# Patient Record
Sex: Female | Born: 1965
Health system: Southern US, Community
[De-identification: ages and names within clinical notes are randomized; demographics above are authoritative.]

## PROBLEM LIST (undated history)

## (undated) DIAGNOSIS — K219 Gastro-esophageal reflux disease without esophagitis: Secondary | ICD-10-CM

## (undated) DIAGNOSIS — Z8619 Personal history of other infectious and parasitic diseases: Secondary | ICD-10-CM

## (undated) DIAGNOSIS — K921 Melena: Secondary | ICD-10-CM

## (undated) DIAGNOSIS — E785 Hyperlipidemia, unspecified: Secondary | ICD-10-CM

## (undated) DIAGNOSIS — Z86010 Personal history of colonic polyps: Secondary | ICD-10-CM

## (undated) DIAGNOSIS — E039 Hypothyroidism, unspecified: Secondary | ICD-10-CM

## (undated) DIAGNOSIS — K5792 Diverticulitis of intestine, part unspecified, without perforation or abscess without bleeding: Secondary | ICD-10-CM

## (undated) DIAGNOSIS — E063 Autoimmune thyroiditis: Secondary | ICD-10-CM

## (undated) DIAGNOSIS — D649 Anemia, unspecified: Secondary | ICD-10-CM

## (undated) HISTORY — DX: Personal history of colonic polyps: Z86.010

## (undated) HISTORY — PX: TUBAL LIGATION: SHX77

## (undated) HISTORY — PX: NISSEN FUNDOPLICATION: SHX2091

## (undated) HISTORY — DX: Gastro-esophageal reflux disease without esophagitis: K21.9

## (undated) HISTORY — DX: Anemia, unspecified: D64.9

## (undated) HISTORY — DX: Melena: K92.1

## (undated) HISTORY — DX: Hyperlipidemia, unspecified: E78.5

## (undated) HISTORY — DX: Diverticulitis of intestine, part unspecified, without perforation or abscess without bleeding: K57.92

## (undated) HISTORY — DX: Personal history of other infectious and parasitic diseases: Z86.19

---
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
## 2001-01-28 ENCOUNTER — Encounter (INDEPENDENT_AMBULATORY_CARE_PROVIDER_SITE_OTHER): Payer: Self-pay

## 2001-01-28 ENCOUNTER — Ambulatory Visit (HOSPITAL_COMMUNITY): Admission: RE | Admit: 2001-01-28 | Discharge: 2001-01-28 | Payer: Self-pay | Admitting: *Deleted

## 2001-05-06 ENCOUNTER — Ambulatory Visit (HOSPITAL_COMMUNITY): Admission: RE | Admit: 2001-05-06 | Discharge: 2001-05-06 | Payer: Self-pay | Admitting: Gastroenterology

## 2001-05-16 ENCOUNTER — Encounter: Admission: RE | Admit: 2001-05-16 | Discharge: 2001-05-16 | Payer: Self-pay | Admitting: General Surgery

## 2001-05-16 ENCOUNTER — Encounter: Payer: Self-pay | Admitting: General Surgery

## 2001-05-21 ENCOUNTER — Encounter: Payer: Self-pay | Admitting: General Surgery

## 2001-05-21 ENCOUNTER — Encounter: Admission: RE | Admit: 2001-05-21 | Discharge: 2001-05-21 | Payer: Self-pay | Admitting: General Surgery

## 2001-06-08 ENCOUNTER — Inpatient Hospital Stay (HOSPITAL_COMMUNITY): Admission: EM | Admit: 2001-06-08 | Discharge: 2001-06-09 | Payer: Self-pay | Admitting: General Surgery

## 2001-08-15 ENCOUNTER — Encounter: Admission: RE | Admit: 2001-08-15 | Discharge: 2001-08-15 | Payer: Self-pay | Admitting: General Surgery

## 2001-08-15 ENCOUNTER — Encounter: Payer: Self-pay | Admitting: General Surgery

## 2004-08-03 ENCOUNTER — Ambulatory Visit (HOSPITAL_COMMUNITY): Admission: RE | Admit: 2004-08-03 | Discharge: 2004-08-03 | Payer: Self-pay | Admitting: Gastroenterology

## 2004-08-03 ENCOUNTER — Encounter (INDEPENDENT_AMBULATORY_CARE_PROVIDER_SITE_OTHER): Payer: Self-pay | Admitting: Specialist

## 2004-12-14 ENCOUNTER — Encounter: Admission: RE | Admit: 2004-12-14 | Discharge: 2004-12-14 | Payer: Self-pay | Admitting: Gastroenterology

## 2007-01-02 ENCOUNTER — Other Ambulatory Visit: Admission: RE | Admit: 2007-01-02 | Discharge: 2007-01-02 | Payer: Self-pay | Admitting: Gynecology

## 2007-09-13 ENCOUNTER — Encounter: Admission: RE | Admit: 2007-09-13 | Discharge: 2007-09-13 | Payer: Self-pay | Admitting: Gynecology

## 2007-09-20 ENCOUNTER — Encounter: Admission: RE | Admit: 2007-09-20 | Discharge: 2007-09-20 | Payer: Self-pay | Admitting: Gynecology

## 2009-12-29 ENCOUNTER — Ambulatory Visit: Payer: Self-pay | Admitting: Gynecology

## 2009-12-29 ENCOUNTER — Other Ambulatory Visit: Admission: RE | Admit: 2009-12-29 | Discharge: 2009-12-29 | Payer: Self-pay | Admitting: Gynecology

## 2010-06-26 ENCOUNTER — Encounter: Payer: Self-pay | Admitting: Gynecology

## 2010-10-21 NOTE — Op Note (Signed)
NAMECORLENE, SABIA                 ACCOUNT NO.:  [id number]   MEDICAL RECORD NO.:  [id number]          PATIENT TYPE:  AMB   LOCATION:  ENDO                         FACILITY:  [facility]   PHYSICIAN:  James L. Malon Kindle., M.D.DATE OF BIRTH:  [birth year]   DATE OF PROCEDURE:  08/03/2004  DATE OF DISCHARGE:                                 OPERATIVE REPORT   PROCEDURE:  Colonoscopy and coagulation of polyp.   MEDICATIONS:  The patient received a total of fentanyl 125 mcg, Versed 12 mg  for both procedures.   SCOPE:  Olympus pediatric adjustable colonoscope.   INDICATIONS:  Previous history of colon polyps. This is done as a followup  procedure.   DESCRIPTION OF PROCEDURE:  The procedure had been explained to the patient  and consent obtained.  With the patient in the left lateral decubitus  position, the Olympus pediatric adjustable scope was inserted and advanced.  The prep was excellent.  Using abdominal pressure and position changes, we  were able to advance to the cecum.  The ileocecal valve and appendiceal  orifice were seen.  The  scope was withdrawn and the cecum, ascending colon  and transverse colon were normal.  In the descending colon a 3 mm sessile  polyp was encountered and was cauterized with the hot biopsy forceps.  No  other polyps were seen in the descending colon or sigmoid.  The rectum was  free of polyps.  The scope was withdrawn. The patient tolerated the  procedure well.   ASSESSMENT:  1.  Descending colon polyp cauterized, 210.3.  2.  History of colon polyps, V12.72.   PLAN:  Will check pathology.  Routine post polypectomy instructions.  Will  recommend repeating procedure in five years and recommend yearly hemoccults.      JLE/MEDQ  D:  08/03/2004  T:  08/03/2004  Job:  [id number]

## 2010-10-21 NOTE — Op Note (Signed)
[facility]  Patient:    [name], [name] Visit Number: [id number] MRN: [id number]          Service Type: SUR Location: 4W 0484 02 Attending Physician:  Brandy Hale Dictated by:   Angelia Mould. Derrell Lolling, M.D. Proc. Date: 06/07/01 Admit Date:  06/07/2001   CC:         Fayrene Fearing L. Randa Evens, M.D.  Jamesetta Geralds, M.D.   Operative Report  PREOPERATIVE DIAGNOSIS:  Gastroesophageal reflux disease with Barretts esophagus.  POSTOPERATIVE DIAGNOSIS:  Gastroesophageal reflux disease with Barretts esophagus.  OPERATION PERFORMED:  Laparoscopic Nissen fundoplication.  SURGEON:  Angelia Mould. Derrell Lolling, M.D.  FIRST ASSISTANT:  Sharlet Salina T. Hoxworth, M.D.  OPERATIVE INDICATIONS:  This is a 45 year old white female who has a four year history of heartburn and reflux which has been progressive despite the use of high dose proton pump inhibitors.  Endoscopy shows Barretts esophagus which is histologically confirmed, but there is no dysplasia.  She also had some small esophageal ulcers just above the GE junction on upper endoscopy on January 28, 2001.  Upper GI shows good peristalsis, and no mucosal abnormality of the esophagus or stomach, and no emptying problems.  She refused esophageal manometry.  Gallbladder ultrasound is normal.  Because of progressive symptoms and her Barretts esophagus, she is brought to the operating room electively for antireflux surgery.  DESCRIPTION OF PROCEDURE:  Following the induction of general endotracheal anesthesia, the patients abdomen was prepped and draped in a sterile fashion. Marcaine 0.5% with epinephrine was used as a local infiltration anesthetic.  A short vertical incision was made above the umbilicus.  The fascia was incised in the midline and the abdominal cavity entered under direct vision.  A 10 mm Hasson trocar was inserted and secured with a pursestring suture of 0 Vicryl. Pneumoperitoneum was created.  Video camera was  inserted.  She had a very large falciform ligament which we had to elevate to visualize everything. Survey of the abdomen showed that the liver appeared to be normal.  The stomach and duodenum appeared normal.  The gallbladder looks normal.  The large bowel and small bowel and the peritoneal surfaces looked normal.  She had slightly enlarged hiatus which required a closure.  The spleen looked normal.  A 5 mm trocar was placed in the subxiphoid area, and a 5 mm liver retractor was placed to elevate the left lobe of the liver.  A 10 mm trocar was placed in the left subcostal region and two 10 mm trocars were placed in the right upper quadrant.  We used the [employer].  With this, we took down the gastrohepatic ligament.  We dissected the right crus away from the esophagus, identifying the posterior vagus nerve which was preserved against the posterior wall of the esophagus.  The areolar tissue around the apex of the right and left crura was taken down.  We mobilized the left crura anteriorly.  We then divided the short gastric vessels with the ultrasonic dissector.  We had to take some care around the fundus as the stomach was very closely applied to the splenic capsule, but we were able to do this without much bleeding at all, and the stomach looked fine at the completion of this dissection.  We further rolled the fundus of the stomach to the right, and with this, we were able to completely dissect the fundus off of the left crus. We created a fairly generous window behind the esophagus and visualized all  the structures.  The fundus was quite floppy without any tension on it whatsoever.  We closed the crura posteriorly with two interrupted sutures of 0 Surgitek. We then passed a 46-French dilator down beside the 18-French nasogastric tube and it appeared that the crural closure was adequate and it did not require any further sutures.  We passed the fundus  behind the esophagus and found that we could do a very nice loose fundoplication without any problem.  With the dilator in place, we performed a fundoplication with three interrupted sutures of 0 Surgitek.  A suture was placed in the left side of the fundus, anterior wall of the esophagus, and right side of the fundus, and then each of the three sutures was tied and marked with a clip.  This looked like a very good loose fundoplication and without any tension whatsoever.  The dilator was removed.  During the case, we had a small superficial laceration of the left lobe of the liver posteriorly, but this was controlled with a little bit of pressure and Surgicel, and at the completion of the case, we irrigated this area out, and there was no bleeding whatsoever.  A small piece of Surgicel was left in place in this area.  Photographs were taken.  The trocars were removed under direct vision and there was no bleeding from the trocar sites.  The pneumoperitoneum was released.  The fascia at the supraumbilical trocar site was closed with 0 Vicryl suture.  The skin incisions were closed with subcuticular sutures of 4-0 Vicryl and Steri-Strips.  Clean bandages were placed and the patient taken to the recovery room in stable condition.  Estimated blood loss was about 30 cc.  Complications none. Sponge and instrument counts were correct. Dictated by:   Angelia Mould. Derrell Lolling, M.D. Attending Physician:  Brandy Hale DD:  06/07/01 TD:  06/08/01 Job: [id number]

## 2010-10-21 NOTE — Procedures (Signed)
. [facility]  Patient:    BELEM, HINTZE Visit Number: [id number] MRN: [id number]          Service Type: END Location: ENDO Attending Physician:  Orland Mustard Dictated by:   Llana Aliment. Randa Evens, M.D. Proc. Date: 01/28/01 Admit Date:  05/06/2001 Discharge Date: 05/06/2001   CC:         Jamesetta Geralds, M.D.   Procedure Report  PROCEDURE PERFORMED:  Colonoscopy and polypectomy.  ENDOSCOPIST:  Llana Aliment. Randa Evens, M.D.  MEDICATIONS USED:  Fentanyl 125 mcg, Versed 12 mg IV.  INSTRUMENT:  Pediatric Olympus video colonoscope.  INDICATIONS:  Previous history of colon polyps removed in another location.  DESCRIPTION OF PROCEDURE:  The procedure had been explained to the patient and consent obtained.  With the patient in the left lateral decubitus position following endoscopy, she was turned around and the scope was inserted and advanced under direct visualization.  The prep was excellent and we were able to advance to the cecum.  The scope was withdrawn.  The cecum, ascending colon, hepatic flexure, transverse colon, splenic flexure, descending and sigmoid colon were seen well.  The sigmoid colon two polyps 0.5 cm each were removed with the snare and sucked through the scope.  No other polyps were seen.  Scope withdrawn, patient tolerated the procedure well.  Maintained on low flow oxygen and pulse oximeter throughout the procedure with no obvious problem.  ASSESSMENT:  Sigmoid colon polyps removed.  PLAN:  Will recommend repeating in three years. Dictated by:   Llana Aliment. Randa Evens, M.D. Attending Physician:  Orland Mustard DD:  05/07/01 TD:  05/08/01 Job: [id number]

## 2010-10-21 NOTE — Procedures (Signed)
[facility]  Patient:    [name], [name] Visit Number: [id number] MRN: [id number]          Service Type: END Location: ENDO Attending Physician:  Orland Mustard Proc. Date: 01/28/01 Adm. Date:  01/28/2001   CC:         Vikki Ports, M.D.  Jamesetta Geralds, M.D.   Procedure Report  PROCEDURE:  Esophagogastroduodenoscopy with biopsy.  MEDICATIONS:  Hurricaine spray, fentanyl 75 mcg, Versed 8 mg IV.  INDICATION:  Previous history of Barretts esophagus  DESCRIPTION OF PROCEDURE:  The procedure had been explained to the patient and consent obtained.  With the patient in the left lateral decubitus position, the Olympus video endoscope was inserted and advanced under direct visualization.  The distal esophagus was reached.  The patient had a fairly distinct squamous columnar junction with possibly a small area of Barretts. It appeared to be more of a hiatal hernia than a long segment of Barretts. There were two esophageal gastric ulcers right at the apparent GE junction that were quite small diametrically across from each other, similar to kissing ulcers.  I did take multiple biopsies of the hiatal hernia sac.  We passed on down to the stomach, and the pylorus identified and passed.  The duodenum, including the bulb and second portion, were seen well and were unremarkable. The scope was withdrawn.  The patient tolerated the procedure well, was maintained on low-flow oxygen and pulse oximetry throughout the procedure.  ASSESSMENT: 1. Esophageal ulcers, probably consistent with continuent reflux. 2. Barretts esophagus versus hiatal hernia.  My impression is that this    probably is a hiatal hernia sac and not true Barretts.  PLAN: 1. We will check pathology for intestinal metaplasia. 2. Increase Nexium to b.i.d. 3. We will see back in the office in two months and discuss antireflux    procedure. Attending Physician:  Orland Mustard DD:   01/28/01 TD:  01/29/01 Job: [id number]

## 2010-10-21 NOTE — Op Note (Signed)
[name], [name]                 ACCOUNT NO.:  [id number]   MEDICAL RECORD NO.:  [id number]          PATIENT TYPE:  AMB   LOCATION:  ENDO                         FACILITY:  [facility]   PHYSICIAN:  James L. Malon Kindle., M.D.DATE OF BIRTH:  [birth year]   DATE OF PROCEDURE:  08/03/2004  DATE OF DISCHARGE:                                 OPERATIVE REPORT   PROCEDURE:  Esophagogastroduodenoscopy.   MEDICATIONS:  Fentanyl 75 mcg, Versed 10 mg IV.   INDICATIONS:  A nice 45 year old who has had previous Nissen fundoplication.  Had a lot of bloating and some reflux.  In view of this, endoscopy was  performed.  There was some question of varix and we wanted to re-evaluate  this area.   DESCRIPTION OF PROCEDURE:  The procedure had been explained to the patient  and consent obtained.  The patient was in the left lateral decubitus  position.  The Olympus scope was inserted and advanced.  We advanced into  the stomach.  The pylorus was identified and passed.  The duodenum including  the bulb and second portion were seen well.  There were no signs of gastric  outlet obstruction or ulcerations, etc., in the duodenum.  The scope was  placed in the retroflex view.  The wrap was seen to be intact.  There was a  bit of hiatal hernia above the wrap but there was a very sharp demarcation  line at the GE junction.  There was no obvious signs of Barrett's esophagus.  The distal esophagus and proximal esophagus were endoscopically normal.  The  scope was withdrawn.  The patient tolerated the procedure well.   ASSESSMENT:  Bloating and reflux appears to be doing well after Nissen  fundoplication.  No signs of Barrett's esophagus.  530.81.   PLAN:  We will start the patient on Reglan and see if that  helps her  bloating, 10 mg b.i.d. and see back in the office in two months or so.  Will  proceed with colonoscopy at this time.      JLE/MEDQ  D:  08/03/2004  T:  08/03/2004  Job:  [id number]   cc:   Leanne Chang, M.D.  [address]  [place]  [place] [address]  Fax: [phone]   Angelia Mould. Derrell Lolling, M.D.  [address]  [place]  [place] [address]

## 2011-04-25 ENCOUNTER — Ambulatory Visit (INDEPENDENT_AMBULATORY_CARE_PROVIDER_SITE_OTHER): Payer: 59 | Admitting: Gynecology

## 2011-04-25 ENCOUNTER — Other Ambulatory Visit (HOSPITAL_COMMUNITY)
Admission: RE | Admit: 2011-04-25 | Discharge: 2011-04-25 | Disposition: A | Discharge status: Home / Self Care | Payer: 59 | Source: Ambulatory Visit | Attending: Gynecology | Admitting: Gynecology

## 2011-04-25 ENCOUNTER — Encounter: Payer: Self-pay | Admitting: Gynecology

## 2011-04-25 VITALS — BP 124/78 | Ht 65.0 in | Wt 164.0 lb

## 2011-04-25 DIAGNOSIS — Z01419 Encounter for gynecological examination (general) (routine) without abnormal findings: Secondary | ICD-10-CM

## 2011-04-25 DIAGNOSIS — F17200 Nicotine dependence, unspecified, uncomplicated: Secondary | ICD-10-CM | POA: Insufficient documentation

## 2011-04-25 DIAGNOSIS — E7800 Pure hypercholesterolemia, unspecified: Secondary | ICD-10-CM

## 2011-04-25 DIAGNOSIS — R7989 Other specified abnormal findings of blood chemistry: Secondary | ICD-10-CM

## 2011-04-25 DIAGNOSIS — R635 Abnormal weight gain: Secondary | ICD-10-CM

## 2011-04-25 DIAGNOSIS — R319 Hematuria, unspecified: Secondary | ICD-10-CM

## 2011-04-25 LAB — CBC WITH DIFFERENTIAL/PLATELET
Eosinophils Absolute: 0.2 10*3/uL (ref 0.0–0.7)
Eosinophils Relative: 2 % (ref 0–5)
Hemoglobin: 12.6 g/dL (ref 12.0–15.0)
Lymphocytes Relative: 27 % (ref 12–46)
Lymphs Abs: 2.7 10*3/uL (ref 0.7–4.0)
MCH: 28.6 pg (ref 26.0–34.0)
MCV: 88.4 fL (ref 78.0–100.0)
Monocytes Relative: 5 % (ref 3–12)
Platelets: 323 10*3/uL (ref 150–400)
RBC: 4.4 MIL/uL (ref 3.87–5.11)
WBC: 9.9 10*3/uL (ref 4.0–10.5)

## 2011-04-25 LAB — TSH: TSH: 5.03 u[IU]/mL — ABNORMAL HIGH (ref 0.350–4.500)

## 2011-04-25 LAB — CHOLESTEROL, TOTAL: Cholesterol: 225 mg/dL — ABNORMAL HIGH (ref 0–200)

## 2011-04-25 NOTE — Progress Notes (Signed)
[name] [birth year] [id number]   History:    45 y.o.  for annual exam with no complaints. Review of her record indicated she's had a previous tubal sterilization procedure. She is adopted. She is a chronic smoker whereby she smokes one pack a cigarettes every 3 days is currently working on this vigorously whereby she is tired a trainer 3 times a week and works in the gym 5 times a week. Her last mammogram was in 2009. She does her monthly self breast examination. Her cycles are regular every 30 days lasting 5 days. She had been concerned with some weight gain although she is exercising. Her BMI is 28.  Past medical history,surgical history, family history and social history were all reviewed and documented in the EPIC chart.  Gynecologic History Patient's last menstrual period was 03/24/2011. Contraception: tubal ligation Last Pap: 2011. Results were: normal Last mammogram: 2009. Results were: normal  Obstetric History OB History    Grav Para Term Preterm Abortions TAB SAB Ect Mult Living   3 2 2  1  1   2      # Outc Date GA Lbr Len/2nd Wgt Sex Del Anes PTL Lv   1 TRM     F SVD  No Yes   2 TRM     M SVD  No Yes   3 SAB                ROS:  Was performed and pertinent positives and negatives are included in the history.  Exam: chaperone present  BP 124/78  Ht 5\' 5"  (1.651 m)  Wt 164 lb (74.39 kg)  BMI 27.29 kg/m2  LMP 03/24/2011  Body mass index is 27.29 kg/(m^2).  General appearance : Well developed well nourished female. No acute distress HEENT: Neck supple, trachea midline, no carotid bruits, no thyroidmegaly Lungs: Clear to auscultation, no rhonchi or wheezes, or rib retractions  Heart: Regular rate and rhythm, no murmurs or gallops Breast:Examined in sitting and supine position were symmetrical in appearance, no palpable masses or tenderness,  no skin retraction, no nipple inversion, no nipple discharge, no skin discoloration, no axillary or supraclavicular  lymphadenopathy Abdomen: no palpable masses or tenderness, no rebound or guarding Extremities: no edema or skin discoloration or tenderness  Pelvic:  Bartholin, Urethra, Skene Glands: Within normal limits             Vagina: No gross lesions or discharge  Cervix: No gross lesions or discharge  Uterus  anteverted, normal size, shape and consistency, non-tender and mobile  Adnexa  Without masses or tenderness  Anus and perineum  normal   Rectovaginal  normal sphincter tone without palpated masses or tenderness             Hemoccult not done     Assessment/Plan:  45 y.o. female for annual exam unremarkable. Patient was given a requisition to schedule her mammogram. [facility] and lateral because of her chronic history of smoking. The following labs will be drawn CBC, TSH, random blood sugar, urinalysis, cholesterol along with her Pap smear. She was encouraged to continue monthly self breast examination and continue to work on her efforts to stop smoking all together because of her increased risk of osteoporosis. She was also instructed to take her calcium and vitamin D one tablet twice a day. We'll see her back in one year or when necessary.    Ok Edwards MD, 12:57  PM 04/25/2011

## 2011-04-25 NOTE — Progress Notes (Signed)
Addended by: Landis Martins R on: 04/25/2011 01:20 PM   Modules accepted: Orders

## 2011-04-26 LAB — URINALYSIS
Leukocytes, UA: NEGATIVE
Nitrite: NEGATIVE
Protein, ur: NEGATIVE mg/dL
pH: 5.5 (ref 5.0–8.0)

## 2011-04-26 NOTE — Progress Notes (Signed)
Addended by: Aura Camps on: 04/26/2011 03:31 PM   Modules accepted: Orders

## 2011-06-23 ENCOUNTER — Other Ambulatory Visit: Payer: 59

## 2011-07-12 ENCOUNTER — Other Ambulatory Visit: Payer: 59

## 2011-07-12 ENCOUNTER — Other Ambulatory Visit: Payer: Self-pay | Admitting: *Deleted

## 2011-07-12 DIAGNOSIS — R7989 Other specified abnormal findings of blood chemistry: Secondary | ICD-10-CM

## 2011-07-12 DIAGNOSIS — E7800 Pure hypercholesterolemia, unspecified: Secondary | ICD-10-CM

## 2011-07-12 DIAGNOSIS — E079 Disorder of thyroid, unspecified: Secondary | ICD-10-CM

## 2011-07-13 LAB — THYROID PANEL WITH TSH
Free Thyroxine Index: 2.3 (ref 1.0–3.9)
T3 Uptake: 30.7 % (ref 22.5–37.0)
T4, Total: 7.6 ug/dL (ref 5.0–12.5)
TSH: 6.76 u[IU]/mL — ABNORMAL HIGH (ref 0.350–4.500)

## 2011-07-13 LAB — LIPID PANEL: Cholesterol: 246 mg/dL — ABNORMAL HIGH (ref 0–200)

## 2011-07-14 ENCOUNTER — Ambulatory Visit (INDEPENDENT_AMBULATORY_CARE_PROVIDER_SITE_OTHER): Payer: 59 | Admitting: Gynecology

## 2011-07-14 ENCOUNTER — Encounter: Payer: Self-pay | Admitting: Gynecology

## 2011-07-14 VITALS — BP 110/80

## 2011-07-14 DIAGNOSIS — E7800 Pure hypercholesterolemia, unspecified: Secondary | ICD-10-CM

## 2011-07-14 DIAGNOSIS — Z860100 Personal history of colon polyps, unspecified: Secondary | ICD-10-CM

## 2011-07-14 DIAGNOSIS — E039 Hypothyroidism, unspecified: Secondary | ICD-10-CM

## 2011-07-14 LAB — AST: AST: 16 U/L (ref 0–37)

## 2011-07-14 LAB — ALT: ALT: 11 U/L (ref 0–35)

## 2011-07-14 MED ORDER — ATORVASTATIN CALCIUM 10 MG PO TABS
10.0000 mg | ORAL_TABLET | Freq: Every day | ORAL | Status: DC
Start: 2011-07-14 — End: 2012-05-24

## 2011-07-14 MED ORDER — LEVOTHYROXINE SODIUM 50 MCG PO TABS: 50.0000 ug | ORAL_TABLET | Freq: Every day | ORAL | Status: DC

## 2011-07-14 NOTE — Patient Instructions (Signed)
Cholesterol Control Diet Cholesterol levels in your body are determined significantly by your diet. Cholesterol levels may also be related to heart disease. The following material helps to explain this relationship and discusses what you can do to help keep your heart healthy. Not all cholesterol is bad. Low-density lipoprotein (LDL) cholesterol is the "bad" cholesterol. It may cause fatty deposits to build up inside your arteries. High-density lipoprotein (HDL) cholesterol is "good." It helps to remove the "bad" LDL cholesterol from your blood. Cholesterol is a very important risk factor for heart disease. Other risk factors are high blood pressure, smoking, stress, heredity, and weight. The heart muscle gets its supply of blood through the coronary arteries. If your LDL cholesterol is high and your HDL cholesterol is low, you are at risk for having fatty deposits build up in your coronary arteries. This leaves less room through which blood can flow. Without sufficient blood and oxygen, the heart muscle cannot function properly and you may feel chest pains (angina pectoris). When a coronary artery closes up entirely, a part of the heart muscle may die, causing a heart attack (myocardial infarction). CHECKING CHOLESTEROL When your caregiver sends your blood to a lab to be analyzed for cholesterol, a complete lipid (fat) profile may be done. With this test, the total amount of cholesterol and levels of LDL and HDL are determined. Triglycerides are a type of fat that circulates in the blood and can also be used to determine heart disease risk. The list below describes what the numbers should be: Test: Total Cholesterol.  Less than 200 mg/dl.  Test: LDL "bad cholesterol."  Less than 100 mg/dl.   Less than 70 mg/dl if you are at very high risk of a heart attack or sudden cardiac death.  Test: HDL "good cholesterol."  Greater than 50 mg/dl for women.   Greater than 40 mg/dl for men.  Test:  Triglycerides.  Less than 150 mg/dl.  CONTROLLING CHOLESTEROL WITH DIET Although exercise and lifestyle factors are important, your diet is key. That is because certain foods are known to raise cholesterol and others to lower it. The goal is to balance foods for their effect on cholesterol and more importantly, to replace saturated and trans fat with other types of fat, such as monounsaturated fat, polyunsaturated fat, and omega-3 fatty acids. On average, a person should consume no more than 15 to 17 g of saturated fat daily. Saturated and trans fats are considered "bad" fats, and they will raise LDL cholesterol. Saturated fats are primarily found in animal products such as meats, butter, and cream. However, that does not mean you need to sacrifice all your favorite foods. Today, there are good tasting, low-fat, low-cholesterol substitutes for most of the things you like to eat. Choose low-fat or nonfat alternatives. Choose round or loin cuts of red meat, since these types of cuts are lowest in fat and cholesterol. Chicken (without the skin), fish, veal, and ground turkey breast are excellent choices. Eliminate fatty meats, such as hot dogs and salami. Even shellfish have little or no saturated fat. Have a 3 oz (85 g) portion when you eat lean meat, poultry, or fish. Trans fats are also called "partially hydrogenated oils." They are oils that have been scientifically manipulated so that they are solid at room temperature resulting in a longer shelf life and improved taste and texture of foods in which they are added. Trans fats are found in stick margarine, some tub margarines, cookies, crackers, and baked goods.  When   baking and cooking, oils are an excellent substitute for butter. The monounsaturated oils are especially beneficial since it is believed they lower LDL and raise HDL. The oils you should avoid entirely are saturated tropical oils, such as coconut and palm.  Remember to eat liberally from food  groups that are naturally free of saturated and trans fat, including fish, fruit, vegetables, beans, grains (barley, rice, couscous, bulgur wheat), and pasta (without cream sauces).  IDENTIFYING FOODS THAT LOWER CHOLESTEROL  Soluble fiber may lower your cholesterol. This type of fiber is found in fruits such as apples, vegetables such as broccoli, potatoes, and carrots, legumes such as beans, peas, and lentils, and grains such as barley. Foods fortified with plant sterols (phytosterol) may also lower cholesterol. You should eat at least 2 g per day of these foods for a cholesterol lowering effect.  Read package labels to identify low-saturated fats, trans fats free, and low-fat foods at the supermarket. Select cheeses that have only 2 to 3 g saturated fat per ounce. Use a heart-healthy tub margarine that is free of trans fats or partially hydrogenated oil. When buying baked goods (cookies, crackers), avoid partially hydrogenated oils. Breads and muffins should be made from whole grains (whole-wheat or whole oat flour, instead of "flour" or "enriched flour"). Buy non-creamy canned soups with reduced salt and no added fats.  FOOD PREPARATION TECHNIQUES  Never deep-fry. If you must fry, either stir-fry, which uses very little fat, or use non-stick cooking sprays. When possible, broil, bake, or roast meats, and steam vegetables. Instead of dressing vegetables with butter or margarine, use lemon and herbs, applesauce and cinnamon (for squash and sweet potatoes), nonfat yogurt, salsa, and low-fat dressings for salads.  LOW-SATURATED FAT / LOW-FAT FOOD SUBSTITUTES Meats / Saturated Fat (g)  Avoid: Steak, marbled (3 oz/85 g) / 11 g   Choose: Steak, lean (3 oz/85 g) / 4 g   Avoid: Hamburger (3 oz/85 g) / 7 g   Choose: Hamburger, lean (3 oz/85 g) / 5 g   Avoid: Ham (3 oz/85 g) / 6 g   Choose: Ham, lean cut (3 oz/85 g) / 2.4 g   Avoid: Chicken, with skin, dark meat (3 oz/85 g) / 4 g   Choose: Chicken,  skin removed, dark meat (3 oz/85 g) / 2 g   Avoid: Chicken, with skin, light meat (3 oz/85 g) / 2.5 g   Choose: Chicken, skin removed, light meat (3 oz/85 g) / 1 g  Dairy / Saturated Fat (g)  Avoid: Whole milk (1 cup) / 5 g   Choose: Low-fat milk, 2% (1 cup) / 3 g   Choose: Low-fat milk, 1% (1 cup) / 1.5 g   Choose: Skim milk (1 cup) / 0.3 g   Avoid: Hard cheese (1 oz/28 g) / 6 g   Choose: Skim milk cheese (1 oz/28 g) / 2 to 3 g   Avoid: Cottage cheese, 4% fat (1 cup) / 6.5 g   Choose: Low-fat cottage cheese, 1% fat (1 cup) / 1.5 g   Avoid: Ice cream (1 cup) / 9 g   Choose: Sherbet (1 cup) / 2.5 g   Choose: Nonfat frozen yogurt (1 cup) / 0.3 g   Choose: Frozen fruit bar / trace   Avoid: Whipped cream (1 tbs) / 3.5 g   Choose: Nondairy whipped topping (1 tbs) / 1 g  Condiments / Saturated Fat (g)  Avoid: Mayonnaise (1 tbs) / 2 g   Choose: Low-fat mayonnaise (  1 tbs) / 1 g   Avoid: Butter (1 tbs) / 7 g   Choose: Extra light margarine (1 tbs) / 1 g   Avoid: Coconut oil (1 tbs) / 11.8 g   Choose: Olive oil (1 tbs) / 1.8 g   Choose: Corn oil (1 tbs) / 1.7 g   Choose: Safflower oil (1 tbs) / 1.2 g   Choose: Sunflower oil (1 tbs) / 1.4 g   Choose: Soybean oil (1 tbs) / 2.4 g   Choose: Canola oil (1 tbs) / 1 g  Document Released: 05/22/2005 Document Revised: 02/01/2011 Document Reviewed: 11/10/2010 [employer] Patient Information 2012 [employer]. 

## 2011-07-14 NOTE — Progress Notes (Signed)
The patient is a 46 year old who was seen the office on November 20 her annual gynecological examination. Patient presented to the office to discuss recent abnormal labs as follows:  Total cholesterol elevated at 246 Triglycerides elevated at 283 HDL low at 31 LDL elevated at 158 VLDL elevated at 57   TSH elevated at 6.70   Patient is adopted so she does not know anything about her past medical history. Patient recently did lifestyle modification changes she stopped smoking 2 months ago. The past 8 months she's been working with a trainer on a regular basis and has had nutritional modification as well. Despite all this patient with evidence of hyperlipidemia. We had a lengthy discussion on the risks benefits and pros and cons statins to include muscle aches, long term usage associated with type 2 diabetes and most recently released information the potential risk of macular degeneration. The benefits outweigh the risk in this patient she fully understands and accepts. She should continue with her diet and exercise program. Additional information on cholesterol-lowering diet will be provided today.  Patient also had been complaining of tiredness and fatigue and low energy which would explain her elevated TSH. We discussed thyroid treatment its risks benefits and pros and cons.  Assessment/plan: #1. Hyperlipidemia. Patient will be placed on Lipitor 10 mg 1 by mouth daily. Patient will have baseline liver function test today to include SGOT and SGPT her baseline. We'll repeat in 6 months along with a fasting lipid profile and office visit. She was instructed also to take only to 3 fish oil 800 mg daily. #2 hypothyroidism patient will be started on Synthroid 50 mcg daily and will return to the office in one month to check TSH levels. Patient with prior tubal sterilization procedure. All questions were answered we'll follow accordingly.

## 2012-05-24 ENCOUNTER — Encounter: Payer: Self-pay | Admitting: Gynecology

## 2012-05-24 ENCOUNTER — Ambulatory Visit (INDEPENDENT_AMBULATORY_CARE_PROVIDER_SITE_OTHER): Payer: 59 | Admitting: Gynecology

## 2012-05-24 VITALS — BP 120/88 | Ht 65.0 in | Wt 179.5 lb

## 2012-05-24 DIAGNOSIS — Z23 Encounter for immunization: Secondary | ICD-10-CM

## 2012-05-24 DIAGNOSIS — R635 Abnormal weight gain: Secondary | ICD-10-CM

## 2012-05-24 DIAGNOSIS — K219 Gastro-esophageal reflux disease without esophagitis: Secondary | ICD-10-CM | POA: Insufficient documentation

## 2012-05-24 DIAGNOSIS — E785 Hyperlipidemia, unspecified: Secondary | ICD-10-CM

## 2012-05-24 DIAGNOSIS — E039 Hypothyroidism, unspecified: Secondary | ICD-10-CM

## 2012-05-24 DIAGNOSIS — N938 Other specified abnormal uterine and vaginal bleeding: Secondary | ICD-10-CM

## 2012-05-24 DIAGNOSIS — F17200 Nicotine dependence, unspecified, uncomplicated: Secondary | ICD-10-CM

## 2012-05-24 DIAGNOSIS — Z01419 Encounter for gynecological examination (general) (routine) without abnormal findings: Secondary | ICD-10-CM

## 2012-05-24 DIAGNOSIS — N949 Unspecified condition associated with female genital organs and menstrual cycle: Secondary | ICD-10-CM

## 2012-05-24 MED ORDER — OMEPRAZOLE 10 MG PO CPDR: 10.0000 mg | DELAYED_RELEASE_CAPSULE | Freq: Every day | ORAL | Status: DC

## 2012-05-24 MED ORDER — LEVOTHYROXINE SODIUM 50 MCG PO TABS: 50.0000 ug | ORAL_TABLET | Freq: Every day | ORAL | Status: DC

## 2012-05-24 NOTE — Progress Notes (Addendum)
[name] 1965/07/27 [id number]   History:    46 y.o.  for annual gyn exam with several complaints today. Patient with history of hyperlipidemia and had been started on Lipitor but discontinued in June of this year secondary to myalgias. Patient been complaining of weight gain and lethargic at times. She smokes 2 packs of cigarettes per week. She states her menstrual cycles are regular. Patient with prior tubal sterilization procedure. Her last mammogram was in 2009 and she has not had a followup since then. She has a history of colon polyps dating back to 2005 her last colonoscopy was normal according to patient in 2010. Her last normal Pap smear was in 2012. Patient stated she had 2 bleeding episodes this month. She also been complaining of gastroesophageal reflux. She had a Nissan fundal plication back in 2003 as a result of her gastric reflux. Her gastroenterologist is Dr. Randa Evens. She also has a history of hypothyroidism for which she has been on Synthroid 50 mcg daily. Review of her record indicated that after February 2013  When she was started on Synthroid (TSH elevated at 6.760) she did not return for followup TSH.  Past medical history,surgical history, family history and social history were all reviewed and documented in the EPIC chart.  Gynecologic History Patient's last menstrual period was 05/08/2012. Contraception: tubal ligation Last Pap: [id number]. Results were: normal Last mammogram: 2009. Results were: normal  Obstetric History OB History    Grav Para Term Preterm Abortions TAB SAB Ect Mult Living   3 2 2  1  1   2      # Outc Date GA Lbr Len/2nd Wgt Sex Del Anes PTL Lv   1 TRM     F SVD  No Yes   2 TRM     M SVD  No Yes   3 SAB                ROS: A ROS was performed and pertinent positives and negatives are included in the history.  GENERAL: No fevers or chills. HEENT: No change in vision, no earache, sore throat or sinus congestion. NECK: No pain or stiffness.  CARDIOVASCULAR: No chest pain or pressure. No palpitations. PULMONARY: No shortness of breath, cough or wheeze. GASTROINTESTINAL: No abdominal pain, nausea, vomiting or diarrhea, melena or bright red blood per rectum. GENITOURINARY: No urinary frequency, urgency, hesitancy or dysuria. MUSCULOSKELETAL: No joint or muscle pain, no back pain, no recent trauma. DERMATOLOGIC: No rash, no itching, no lesions. ENDOCRINE: No polyuria, polydipsia, no heat or cold intolerance. No recent change in weight. HEMATOLOGICAL: No anemia or easy bruising or bleeding. NEUROLOGIC: No headache, seizures, numbness, tingling or weakness. PSYCHIATRIC: No depression, no loss of interest in normal activity or change in sleep pattern.     Exam: chaperone present  BP 120/88  Ht 5\' 5"  (1.651 m)  Wt 179 lb 8 oz (81.421 kg)  BMI 29.87 kg/m2  LMP 05/08/2012  Body mass index is 29.87 kg/(m^2).  General appearance : Well developed well nourished female. No acute distress HEENT: Neck supple, trachea midline, no carotid bruits, no thyroidmegaly Lungs: Clear to auscultation, no rhonchi or wheezes, or rib retractions  Heart: Regular rate and rhythm, no murmurs or gallops Breast:Examined in sitting and supine position were symmetrical in appearance, no palpable masses or tenderness,  no skin retraction, no nipple inversion, no nipple discharge, no skin discoloration, no axillary or supraclavicular lymphadenopathy Abdomen: no palpable masses or tenderness, no rebound or guarding Extremities:  no edema or skin discoloration or tenderness  Pelvic:  Bartholin, Urethra, Skene Glands: Within normal limits             Vagina: No gross lesions or discharge  Cervix: No gross lesions or discharge  Uterus  anteverted, normal size, shape and consistency, non-tender and mobile  Adnexa  Without masses or tenderness  Anus and perineum  normal   Rectovaginal  normal sphincter tone without palpated masses or tenderness             Hemoccult  not indicated     Assessment/Plan:  46 y.o. female for annual exam with history of hyperlipidemia could not tolerate Lipitor discontinued in June of this year. Patient with midepigastric discomfort history of gastroesophageal reflux disease and previous Nissan fundoplication. Also patient with past history of colon polyp 2005. Patient states last colonoscopy 2000 and was normal. I'm going to refer her back to Dr. Randa Evens her gastroenterologist for further evaluation her midepigastric discomfort based on her past history. We are going to check an H. pylori today. I will prescribe her in the meantime Prilosec 10 mg daily. We will need Dr. Randa Evens to assist in her management of hyperlipidemia as well.  Patients going to return to the office next week in a fasting state to check a lipid profile along with comprehensive metabolic panel, and TSH. Urinalysis obtained today. No Pap smear done today. New Pap smear screening guidelines discussed. Patient received today the Tdap vaccine as well. Due to the fact that patient had 2 periods in one month she underwent an endometrial biopsy today and tissue was submitted for histological evaluation. She will return back in the next few weeks for sonohysterogram to completely assess her intrauterine cavity. We discussed today the detrimental effects of smoking. She was encouraged to take her calcium and vitamin D twice a day for osteoporosis prevention. A chest x-ray PA and lateral was ordered today as well. She was reminded also to schedule her overdue mammogram.   Ok Edwards MD, 10:32 PM 05/24/2012

## 2012-05-24 NOTE — Patient Instructions (Addendum)
Smoking Cessation Quitting smoking is important to your health and has many advantages. However, it is not always easy to quit since nicotine is a very addictive drug. Often times, people try 3 times or more before being able to quit. This document explains the best ways for you to prepare to quit smoking. Quitting takes hard work and a lot of effort, but you can do it. ADVANTAGES OF QUITTING SMOKING  You will live longer, feel better, and live better.  Your body will feel the impact of quitting smoking almost immediately.  Within 20 minutes, blood pressure decreases. Your pulse returns to its normal level.  After 8 hours, carbon monoxide levels in the blood return to normal. Your oxygen level increases.  After 24 hours, the chance of having a heart attack starts to decrease. Your breath, hair, and body stop smelling like smoke.  After 48 hours, damaged nerve endings begin to recover. Your sense of taste and smell improve.  After 72 hours, the body is virtually free of nicotine. Your bronchial tubes relax and breathing becomes easier.  After 2 to 12 weeks, lungs can hold more air. Exercise becomes easier and circulation improves.  The risk of having a heart attack, stroke, cancer, or lung disease is greatly reduced.  After 1 year, the risk of coronary heart disease is cut in half.  After 5 years, the risk of stroke falls to the same as a nonsmoker.  After 10 years, the risk of lung cancer is cut in half and the risk of other cancers decreases significantly.  After 15 years, the risk of coronary heart disease drops, usually to the level of a nonsmoker.  If you are pregnant, quitting smoking will improve your chances of having a healthy baby.  The people you live with, especially any children, will be healthier.  You will have extra money to spend on things other than cigarettes. QUESTIONS TO THINK ABOUT BEFORE ATTEMPTING TO QUIT You may want to talk about your answers with your  caregiver.  Why do you want to quit?  If you tried to quit in the past, what helped and what did not?  What will be the most difficult situations for you after you quit? How will you plan to handle them?  Who can help you through the tough times? Your family? Friends? A caregiver?  What pleasures do you get from smoking? What ways can you still get pleasure if you quit? Here are some questions to ask your caregiver:  How can you help me to be successful at quitting?  What medicine do you think would be best for me and how should I take it?  What should I do if I need more help?  What is smoking withdrawal like? How can I get information on withdrawal? GET READY  Set a quit date.  Change your environment by getting rid of all cigarettes, ashtrays, matches, and lighters in your home, car, or work. Do not let people smoke in your home.  Review your past attempts to quit. Think about what worked and what did not. GET SUPPORT AND ENCOURAGEMENT You have a better chance of being successful if you have help. You can get support in many ways.  Tell your family, friends, and co-workers that you are going to quit and need their support. Ask them not to smoke around you.  Get individual, group, or telephone counseling and support. Programs are available at local hospitals and health centers. Call your local health department for   information about programs in your area.  Spiritual beliefs and practices may help some smokers quit.  Download a "quit meter" on your computer to keep track of quit statistics, such as how long you have gone without smoking, cigarettes not smoked, and money saved.  Get a self-help book about quitting smoking and staying off of tobacco. LEARN NEW SKILLS AND BEHAVIORS  Distract yourself from urges to smoke. Talk to someone, go for a walk, or occupy your time with a task.  Change your normal routine. Take a different route to work. Drink tea instead of coffee.  Eat breakfast in a different place.  Reduce your stress. Take a hot bath, exercise, or read a book.  Plan something enjoyable to do every day. Reward yourself for not smoking.  Explore interactive web-based programs that specialize in helping you quit. GET MEDICINE AND USE IT CORRECTLY Medicines can help you stop smoking and decrease the urge to smoke. Combining medicine with the above behavioral methods and support can greatly increase your chances of successfully quitting smoking.  Nicotine replacement therapy helps deliver nicotine to your body without the negative effects and risks of smoking. Nicotine replacement therapy includes nicotine gum, lozenges, inhalers, nasal sprays, and skin patches. Some may be available over-the-counter and others require a prescription.  Antidepressant medicine helps people abstain from smoking, but how this works is unknown. This medicine is available by prescription.  Nicotinic receptor partial agonist medicine simulates the effect of nicotine in your brain. This medicine is available by prescription. Ask your caregiver for advice about which medicines to use and how to use them based on your health history. Your caregiver will tell you what side effects to look out for if you choose to be on a medicine or therapy. Carefully read the information on the package. Do not use any other product containing nicotine while using a nicotine replacement product.  RELAPSE OR DIFFICULT SITUATIONS Most relapses occur within the first 3 months after quitting. Do not be discouraged if you start smoking again. Remember, most people try several times before finally quitting. You may have symptoms of withdrawal because your body is used to nicotine. You may crave cigarettes, be irritable, feel very hungry, cough often, get headaches, or have difficulty concentrating. The withdrawal symptoms are only temporary. They are strongest when you first quit, but they will go away within  10 14 days. To reduce the chances of relapse, try to:  Avoid drinking alcohol. Drinking lowers your chances of successfully quitting.  Reduce the amount of caffeine you consume. Once you quit smoking, the amount of caffeine in your body increases and can give you symptoms, such as a rapid heartbeat, sweating, and anxiety.  Avoid smokers because they can make you want to smoke.  Do not let weight gain distract you. Many smokers will gain weight when they quit, usually less than 10 pounds. Eat a healthy diet and stay active. You can always lose the weight gained after you quit.  Find ways to improve your mood other than smoking. FOR MORE INFORMATION  www.smokefree.gov  Document Released: 05/16/2001 Document Revised: 11/21/2011 Document Reviewed: 08/31/2011 [employer] Patient Information 2013 [employer].  

## 2012-05-25 LAB — URINALYSIS W MICROSCOPIC + REFLEX CULTURE
Bacteria, UA: NONE SEEN
Bilirubin Urine: NEGATIVE
Crystals: NONE SEEN
Hgb urine dipstick: NEGATIVE
Ketones, ur: NEGATIVE mg/dL
Nitrite: NEGATIVE
Protein, ur: NEGATIVE mg/dL
Urobilinogen, UA: 0.2 mg/dL (ref 0.0–1.0)

## 2012-05-28 ENCOUNTER — Other Ambulatory Visit: Payer: 59

## 2012-05-28 DIAGNOSIS — R635 Abnormal weight gain: Secondary | ICD-10-CM

## 2012-05-28 DIAGNOSIS — Z01419 Encounter for gynecological examination (general) (routine) without abnormal findings: Secondary | ICD-10-CM

## 2012-05-28 DIAGNOSIS — K219 Gastro-esophageal reflux disease without esophagitis: Secondary | ICD-10-CM

## 2012-05-28 LAB — CBC WITH DIFFERENTIAL/PLATELET
Basophils Relative: 0 % (ref 0–1)
Hemoglobin: 12 g/dL (ref 12.0–15.0)
Lymphocytes Relative: 26 % (ref 12–46)
MCHC: 32.8 g/dL (ref 30.0–36.0)
Monocytes Relative: 6 % (ref 3–12)
Neutro Abs: 5.3 10*3/uL (ref 1.7–7.7)
Neutrophils Relative %: 66 % (ref 43–77)
RBC: 4.36 MIL/uL (ref 3.87–5.11)
WBC: 8.1 10*3/uL (ref 4.0–10.5)

## 2012-05-28 LAB — THYROID PANEL WITH TSH
Free Thyroxine Index: 1.9 (ref 1.0–3.9)
T3 Uptake: 26.3 % (ref 22.5–37.0)
TSH: 11.117 u[IU]/mL — ABNORMAL HIGH (ref 0.350–4.500)

## 2012-05-28 LAB — COMPREHENSIVE METABOLIC PANEL
ALT: 14 U/L (ref 0–35)
AST: 17 U/L (ref 0–37)
Albumin: 4.2 g/dL (ref 3.5–5.2)
BUN: 11 mg/dL (ref 6–23)
Calcium: 9.1 mg/dL (ref 8.4–10.5)
Chloride: 108 mEq/L (ref 96–112)
Potassium: 4.5 mEq/L (ref 3.5–5.3)

## 2012-05-28 LAB — LIPID PANEL
LDL Cholesterol: 181 mg/dL — ABNORMAL HIGH (ref 0–99)
Triglycerides: 140 mg/dL (ref <150)

## 2012-05-30 ENCOUNTER — Other Ambulatory Visit: Payer: Self-pay | Admitting: *Deleted

## 2012-05-30 DIAGNOSIS — E039 Hypothyroidism, unspecified: Secondary | ICD-10-CM

## 2012-05-30 MED ORDER — LEVOTHYROXINE SODIUM 75 MCG PO TABS: 75.0000 ug | ORAL_TABLET | Freq: Every day | ORAL | Status: DC

## 2012-07-01 ENCOUNTER — Other Ambulatory Visit: Payer: 59

## 2012-07-01 ENCOUNTER — Ambulatory Visit: Payer: 59 | Admitting: Gynecology

## 2012-07-08 ENCOUNTER — Other Ambulatory Visit: Payer: 59

## 2012-07-20 ENCOUNTER — Other Ambulatory Visit: Payer: Self-pay

## 2012-08-27 ENCOUNTER — Ambulatory Visit (INDEPENDENT_AMBULATORY_CARE_PROVIDER_SITE_OTHER): Payer: 59 | Admitting: Endocrinology

## 2012-08-27 ENCOUNTER — Encounter: Payer: Self-pay | Admitting: Endocrinology

## 2012-08-27 VITALS — BP 136/74 | HR 80 | Wt 182.0 lb

## 2012-08-27 DIAGNOSIS — E039 Hypothyroidism, unspecified: Secondary | ICD-10-CM

## 2012-08-27 NOTE — Progress Notes (Signed)
Subjective:    Patient ID: [name], female    DOB: 1965/07/30, 47 y.o.   MRN: [id number]  HPI Pt states 1 year of weight gain, and assoc fatigue.  She has heartburn sensation in the chest.  She was rx'ed synthroid.  Despite improvement in labs, sxs persist.  Most recently, synthroid was increased from 50 to 75 mcg/day, 3 mos ago, after a TSH of 11.   No past medical history on file.  Past Surgical History  Procedure Laterality Date  . Tubal ligation      by laparoscopy  . Nissen fundoplication      History   Social History  . Marital Status: Divorced    Spouse Name: N/A    Number of Children: N/A  . Years of Education: N/A   Occupational History  . Not on file.   Social History Main Topics  . Smoking status: Current Every Day Smoker -- 1.00 packs/day    Types: Cigarettes  . Smokeless tobacco: Never Used  . Alcohol Use: Yes     Comment: occ  . Drug Use: No  . Sexually Active: Yes    Birth Control/ Protection: Surgical     Comment: tubal ligation   Other Topics Concern  . Not on file   Social History Narrative  . No narrative on file    Current Outpatient Prescriptions on File Prior to Visit  Medication Sig Dispense Refill  . AMINO ACIDS COMPLEX PO Take by mouth.        Marland Kitchen b complex vitamins tablet Take 1 tablet by mouth daily.        Marland Kitchen glucosamine-chondroitin 500-400 MG tablet Take 1 tablet by mouth 3 (three) times daily.        Marland Kitchen levothyroxine (SYNTHROID, LEVOTHROID) 50 MCG tablet Take 1 tablet (50 mcg total) by mouth daily.  30 tablet  11  . levothyroxine (SYNTHROID, LEVOTHROID) 75 MCG tablet Take 1 tablet (75 mcg total) by mouth daily.  30 tablet  11  . Multiple Vitamin (MULTIVITAMIN) tablet Take 1 tablet by mouth daily.        Marland Kitchen omeprazole (PRILOSEC) 10 MG capsule Take 1 capsule (10 mg total) by mouth daily.  30 capsule  5   No current facility-administered medications on file prior to visit.    No Known Allergies  Family History  Problem Relation  Age of Onset  . Adopted: Yes   There were no vitals taken for this visit.  Review of Systems denies depression, cramps, fever, numbness, blurry vision, myalgias, dry skin, rhinorrhea, easy bruising, and syncope.  She has light menses.  She has doe, hair loss, and difficulty with concenrtration.  She has alternating constipation and diarrhea.      Objective:   Physical Exam VS: see vs page GEN: no distress HEAD: head: no deformity eyes: no periorbital swelling, no proptosis external nose and ears are normal mouth: no lesion seen NECK: supple, thyroid is not enlarged CHEST WALL: no deformity LUNGS:  Clear to auscultation CV: reg rate and rhythm, no murmur ABD: abdomen is soft, nontender.  no hepatosplenomegaly.  not distended.  no hernia MUSCULOSKELETAL: muscle bulk and strength are grossly normal.  no obvious joint swelling.  gait is normal and steady EXTEMITIES: no deformity.  no ulcer on the feet.  feet are of normal color and temp.  no edema PULSES: dorsalis pedis intact bilat.  no carotid bruit NEURO:  cn 2-12 grossly intact.   readily moves all 4's.  sensation  is intact to touch on the feet SKIN:  Normal texture and temperature.  No rash or suspicious lesion is visible.   NODES:  None palpable at the neck PSYCH: alert, oriented x3.  Does not appear anxious nor depressed.  Lab Results  Component Value Date   TSH 1.05 08/27/2012   T4TOTAL 7.1 05/28/2012      Assessment & Plan:  Hypothyroidism, well-replaced now Weight gain, not thyroid-related Fatigue, not thyroid-related

## 2012-08-27 NOTE — Patient Instructions (Addendum)
blood tests are being requested for you today.  We'll contact you with results. Our first goal should be to normalize the thyroid blood test.   You will probably need this medicine for the rest of your life.   i'm not sure if the thyroid explains all of your symptoms.  We'll have to see with time.

## 2012-09-09 ENCOUNTER — Emergency Department (HOSPITAL_COMMUNITY)
Admission: EM | Admit: 2012-09-09 | Discharge: 2012-09-09 | Disposition: A | Discharge status: Home / Self Care | Payer: 59 | Attending: Emergency Medicine | Admitting: Emergency Medicine

## 2012-09-09 ENCOUNTER — Encounter (HOSPITAL_COMMUNITY): Payer: Self-pay | Admitting: Emergency Medicine

## 2012-09-09 DIAGNOSIS — R42 Dizziness and giddiness: Secondary | ICD-10-CM | POA: Insufficient documentation

## 2012-09-09 DIAGNOSIS — F17200 Nicotine dependence, unspecified, uncomplicated: Secondary | ICD-10-CM | POA: Insufficient documentation

## 2012-09-09 DIAGNOSIS — R55 Syncope and collapse: Secondary | ICD-10-CM | POA: Insufficient documentation

## 2012-09-09 DIAGNOSIS — Z3202 Encounter for pregnancy test, result negative: Secondary | ICD-10-CM | POA: Insufficient documentation

## 2012-09-09 DIAGNOSIS — E039 Hypothyroidism, unspecified: Secondary | ICD-10-CM | POA: Insufficient documentation

## 2012-09-09 DIAGNOSIS — Z79899 Other long term (current) drug therapy: Secondary | ICD-10-CM | POA: Insufficient documentation

## 2012-09-09 HISTORY — DX: Hypothyroidism, unspecified: E03.9

## 2012-09-09 LAB — CBC WITH DIFFERENTIAL/PLATELET
Basophils Absolute: 0 10*3/uL (ref 0.0–0.1)
Eosinophils Absolute: 0.1 10*3/uL (ref 0.0–0.7)
Eosinophils Relative: 1 % (ref 0–5)
HCT: 36.2 % (ref 36.0–46.0)
Lymphocytes Relative: 14 % (ref 12–46)
MCH: 27.3 pg (ref 26.0–34.0)
MCHC: 32.6 g/dL (ref 30.0–36.0)
MCV: 83.8 fL (ref 78.0–100.0)
Monocytes Absolute: 0.6 10*3/uL (ref 0.1–1.0)
RDW: 14.4 % (ref 11.5–15.5)

## 2012-09-09 LAB — COMPREHENSIVE METABOLIC PANEL
Albumin: 3.5 g/dL (ref 3.5–5.2)
BUN: 14 mg/dL (ref 6–23)
Calcium: 9 mg/dL (ref 8.4–10.5)
Chloride: 105 mEq/L (ref 96–112)
Creatinine, Ser: 0.77 mg/dL (ref 0.50–1.10)
GFR calc non Af Amer: >90 mL/min (ref >90)
Total Bilirubin: 0.2 mg/dL — ABNORMAL LOW (ref 0.3–1.2)

## 2012-09-09 MED ORDER — ONDANSETRON HCL 4 MG/2ML IJ SOLN
4.0000 mg | Freq: Once | INTRAMUSCULAR | Status: AC
  Administered 2012-09-09: 4 mg via INTRAVENOUS
  Filled 2012-09-09: qty 2

## 2012-09-09 MED ORDER — SODIUM CHLORIDE 0.9 % IV SOLN
1000.0000 mL | Freq: Once | INTRAVENOUS | Status: AC
  Administered 2012-09-09: 1000 mL via INTRAVENOUS

## 2012-09-09 MED ORDER — SODIUM CHLORIDE 0.9 % IV SOLN: 1000.0000 mL | INTRAVENOUS | Status: DC

## 2012-09-09 MED ORDER — ONDANSETRON 8 MG PO TBDP: 8.0000 mg | ORAL_TABLET | Freq: Three times a day (TID) | ORAL | Status: DC | PRN

## 2012-09-09 NOTE — ED Provider Notes (Signed)
History    CSN: [id number] Arrival date & time 09/09/12  0759 First MD Initiated Contact with Patient 09/09/12 [phone]     CC: Syncope  HPI The patient presents to the emergency room after having a syncopal episode. Patient states she woke up this morning and felt fine. She went outside to smoke a cigarette. She started to feel lightheaded and had the sensation that she was going to pass out. She walked inside and try to get the couch but the next thing she remembers is waking up on the floor. Following that, she became nauseated and had one episode of vomiting. The symptoms has slowly improved. She still does feel somewhat lightheaded and nauseated but otherwise has no other complaints. She has no headache, no chest pain, no abdominal pain. She has not noticed any rectal bleeding. She denies any pain or injuries associated with her episode. She does have a history of prior syncopal events. Patient states she was told in the past was related to low blood sugar. Past Medical History  Diagnosis Date  . Hypothyroid     Past Surgical History  Procedure Laterality Date  . Tubal ligation      by laparoscopy  . Nissen fundoplication      Family History  Problem Relation Age of Onset  . Adopted: Yes    History  Substance Use Topics  . Smoking status: Current Every Day Smoker -- 1.00 packs/day    Types: Cigarettes  . Smokeless tobacco: Never Used  . Alcohol Use: Yes     Comment: occ    OB History   Grav Para Term Preterm Abortions TAB SAB Ect Mult Living   3 2 2  1  1   2       Review of Systems  All other systems reviewed and are negative.    Allergies  Review of patient's allergies indicates no known allergies.  Home Medications   Current Outpatient Rx  Name  Route  Sig  Dispense  Refill  . b complex vitamins tablet   Oral   Take 1 tablet by mouth daily.           . Calcium Carb-Cholecalciferol (CALCIUM + D3) 600-200 MG-UNIT TABS   Oral   Take 1 tablet by mouth  daily.         Marland Kitchen levothyroxine (SYNTHROID, LEVOTHROID) 75 MCG tablet   Oral   Take 1 tablet (75 mcg total) by mouth daily.   30 tablet   11   . omeprazole (PRILOSEC) 10 MG capsule   Oral   Take 1 capsule (10 mg total) by mouth daily.   30 capsule   5   . ondansetron (ZOFRAN ODT) 8 MG disintegrating tablet   Oral   Take 1 tablet (8 mg total) by mouth every 8 (eight) hours as needed for nausea.   20 tablet   0     BP 122/87  Pulse 90  Temp(Src) 97.8 F (36.6 C) (Oral)  Resp 16  SpO2 98%  LMP 09/05/2012  Physical Exam  Nursing note and vitals reviewed. Constitutional: She appears well-developed and well-nourished. No distress.  HENT:  Head: Normocephalic and atraumatic.  Right Ear: External ear normal.  Left Ear: External ear normal.  Eyes: Conjunctivae are normal. Right eye exhibits no discharge. Left eye exhibits no discharge. No scleral icterus.  Neck: Neck supple. No tracheal deviation present.  Cardiovascular: Normal rate, regular rhythm and intact distal pulses.   Pulmonary/Chest: Effort normal and breath sounds  normal. No stridor. No respiratory distress. She has no wheezes. She has no rales.  Abdominal: Soft. Bowel sounds are normal. She exhibits no distension. There is no tenderness. There is no rebound and no guarding.  Musculoskeletal: She exhibits no edema and no tenderness.  Neurological: She is alert. She has normal strength. No sensory deficit. Cranial nerve deficit:  no gross defecits noted. She exhibits normal muscle tone. She displays no seizure activity. Coordination normal.  Skin: Skin is warm and dry. No rash noted.  Psychiatric: She has a normal mood and affect.    ED Course  Procedures (including critical care time) EKG  Rate: 75  Rhythm: normal sinus rhythm  QRS Axis: normal  Intervals: normal  ST/T Wave abnormalities: normal  Conduction Disutrbances:none  Narrative Interpretation: borderline r wave progression  Old EKG Reviewed: no sig  changes  Labs Reviewed  CBC WITH DIFFERENTIAL - Abnormal; Notable for the following:    WBC 12.4 (*)    Hemoglobin 11.8 (*)    Neutrophils Relative 80 (*)    Neutro Abs 9.9 (*)    All other components within normal limits  COMPREHENSIVE METABOLIC PANEL - Abnormal; Notable for the following:    Glucose, Bld 117 (*)    Total Bilirubin 0.2 (*)    All other components within normal limits  PREGNANCY, URINE  POCT CBG (FASTING - GLUCOSE)-MANUAL ENTRY   No results found.   1. Syncope       MDM  Pt with syncopal episode this am.  NO other complaints.  Exam unremarkable.  Labs reassuring.  Pt low risk for acute cardiac dysrhythmia.  Rec follow up with PCP for  Further outpatient testing such as echocardiogram, cardiac monitoring.       Celene Kras, MD 09/09/12 [phone]

## 2012-09-09 NOTE — ED Notes (Signed)
Pt states she went outside to smoke a cigarette and when she came back inside she felt very lightheaded and was going to pass out, states she tried to get to the couch and remembers coming to on the floor.  Pt states she got nauseated and vomited stomach acids. Then got really lightheaded again along with hot sweats & cold spells.

## 2012-12-13 ENCOUNTER — Other Ambulatory Visit: Payer: Self-pay | Admitting: Gynecology

## 2013-04-10 ENCOUNTER — Other Ambulatory Visit: Payer: Self-pay

## 2013-05-21 ENCOUNTER — Emergency Department (HOSPITAL_COMMUNITY): Payer: 59

## 2013-05-21 ENCOUNTER — Encounter (HOSPITAL_BASED_OUTPATIENT_CLINIC_OR_DEPARTMENT_OTHER): Payer: Self-pay | Admitting: Emergency Medicine

## 2013-05-21 ENCOUNTER — Emergency Department (HOSPITAL_BASED_OUTPATIENT_CLINIC_OR_DEPARTMENT_OTHER): Payer: 59

## 2013-05-21 ENCOUNTER — Emergency Department (HOSPITAL_BASED_OUTPATIENT_CLINIC_OR_DEPARTMENT_OTHER)
Admission: EM | Admit: 2013-05-21 | Discharge: 2013-05-22 | Disposition: A | Discharge status: Home / Self Care | Payer: 59 | Attending: Emergency Medicine | Admitting: Emergency Medicine

## 2013-05-21 DIAGNOSIS — E063 Autoimmune thyroiditis: Secondary | ICD-10-CM | POA: Insufficient documentation

## 2013-05-21 DIAGNOSIS — E039 Hypothyroidism, unspecified: Secondary | ICD-10-CM | POA: Insufficient documentation

## 2013-05-21 DIAGNOSIS — F17200 Nicotine dependence, unspecified, uncomplicated: Secondary | ICD-10-CM | POA: Insufficient documentation

## 2013-05-21 DIAGNOSIS — R109 Unspecified abdominal pain: Secondary | ICD-10-CM | POA: Insufficient documentation

## 2013-05-21 DIAGNOSIS — R1110 Vomiting, unspecified: Secondary | ICD-10-CM

## 2013-05-21 DIAGNOSIS — R933 Abnormal findings on diagnostic imaging of other parts of digestive tract: Secondary | ICD-10-CM

## 2013-05-21 HISTORY — DX: Autoimmune thyroiditis: E06.3

## 2013-05-21 LAB — COMPREHENSIVE METABOLIC PANEL
ALT: 20 U/L (ref 0–35)
Alkaline Phosphatase: 87 U/L (ref 39–117)
CO2: 24 mEq/L (ref 19–32)
GFR calc Af Amer: >90 mL/min (ref >90)
GFR calc non Af Amer: >90 mL/min (ref >90)
Glucose, Bld: 121 mg/dL — ABNORMAL HIGH (ref 70–99)
Potassium: 3.8 mEq/L (ref 3.5–5.1)
Sodium: 141 mEq/L (ref 135–145)

## 2013-05-21 LAB — URINALYSIS, ROUTINE W REFLEX MICROSCOPIC
Bilirubin Urine: NEGATIVE
Protein, ur: NEGATIVE mg/dL
Urobilinogen, UA: 0.2 mg/dL (ref 0.0–1.0)

## 2013-05-21 LAB — CBC WITH DIFFERENTIAL/PLATELET
Lymphocytes Relative: 11 % — ABNORMAL LOW (ref 12–46)
Lymphs Abs: 1.5 10*3/uL (ref 0.7–4.0)
MCV: 77.4 fL — ABNORMAL LOW (ref 78.0–100.0)
Neutrophils Relative %: 83 % — ABNORMAL HIGH (ref 43–77)
Platelets: 328 10*3/uL (ref 150–400)
RBC: 4.69 MIL/uL (ref 3.87–5.11)
WBC: 13.7 10*3/uL — ABNORMAL HIGH (ref 4.0–10.5)

## 2013-05-21 LAB — WET PREP, GENITAL: Yeast Wet Prep HPF POC: NONE SEEN

## 2013-05-21 LAB — URINE MICROSCOPIC-ADD ON

## 2013-05-21 MED ORDER — MORPHINE SULFATE 4 MG/ML IJ SOLN
4.0000 mg | Freq: Once | INTRAMUSCULAR | Status: DC
  Filled 2013-05-21: qty 1

## 2013-05-21 MED ORDER — SODIUM CHLORIDE 0.9 % IV SOLN
Freq: Once | INTRAVENOUS | Status: AC
  Administered 2013-05-21: 50 mL/h via INTRAVENOUS

## 2013-05-21 MED ORDER — IOHEXOL 300 MG/ML  SOLN
50.0000 mL | Freq: Once | INTRAMUSCULAR | Status: AC | PRN
  Administered 2013-05-21: 50 mL via ORAL

## 2013-05-21 MED ORDER — PROMETHAZINE HCL 25 MG/ML IJ SOLN
INTRAMUSCULAR | Status: AC
  Filled 2013-05-21: qty 1

## 2013-05-21 MED ORDER — MORPHINE SULFATE 4 MG/ML IJ SOLN
4.0000 mg | Freq: Once | INTRAMUSCULAR | Status: AC
  Administered 2013-05-21: 4 mg via INTRAVENOUS
  Filled 2013-05-21: qty 1

## 2013-05-21 MED ORDER — IOHEXOL 300 MG/ML  SOLN
100.0000 mL | Freq: Once | INTRAMUSCULAR | Status: AC | PRN
  Administered 2013-05-21: 100 mL via INTRAVENOUS

## 2013-05-21 MED ORDER — ONDANSETRON HCL 4 MG/2ML IJ SOLN
4.0000 mg | Freq: Once | INTRAMUSCULAR | Status: AC
  Administered 2013-05-21: 4 mg via INTRAVENOUS
  Filled 2013-05-21: qty 2

## 2013-05-21 MED ORDER — HYDROMORPHONE HCL PF 1 MG/ML IJ SOLN
1.0000 mg | Freq: Once | INTRAMUSCULAR | Status: DC
  Filled 2013-05-21: qty 1

## 2013-05-21 MED ORDER — PROMETHAZINE HCL 25 MG/ML IJ SOLN
12.5000 mg | Freq: Four times a day (QID) | INTRAMUSCULAR | Status: DC | PRN
  Administered 2013-05-21 (×2): 12.5 mg via INTRAVENOUS
  Filled 2013-05-21 (×2): qty 1

## 2013-05-21 MED ORDER — SODIUM CHLORIDE 0.9 % IV SOLN
Freq: Once | INTRAVENOUS | Status: AC
  Administered 2013-05-21: 18:00:00 via INTRAVENOUS

## 2013-05-21 NOTE — ED Provider Notes (Signed)
9:06 PM Patient accepted in transfer. CT pending. Unable to drink much contrast due to nausea and vomiting despite multiple phenergan doses.   12:09 AM patient's CT scan is negative except for possible polyps which I discussed with the patient. Patient will be a fluid challenge. On my exam I did a pelvic exam which shows significant right adnexal tenderness. Due to this we'll get a ultrasound to rule out torsion or TOA. There is no pus or discharge in her vagina to suggest PID. Care transferred to Dr. Fonnie Jarvis with the ultrasound and fluid challenge pending.  Audree Camel, MD 05/22/13 [phone]

## 2013-05-21 NOTE — ED Notes (Signed)
Pelvic supplies at bedside. 

## 2013-05-21 NOTE — ED Notes (Signed)
Fluid challenge initiated. Pt given water to drink.

## 2013-05-21 NOTE — ED Notes (Signed)
Pt transferred from [facility]. C/o RLQ abd pain, 2/10 pain at this time. Pt vomited up oral contrast. Pt transferred to r/o appendicitis.

## 2013-05-21 NOTE — ED Notes (Signed)
Bed: ZO10 Expected date:  Expected time:  Means of arrival:  Comments: [facility]

## 2013-05-21 NOTE — ED Provider Notes (Signed)
CSN: [id number]     Arrival date & time 05/21/13  1716 History   First MD Initiated Contact with Patient 05/21/13 1741     Chief Complaint  Patient presents with  . Abdominal Pain   (Consider location/radiation/quality/duration/timing/severity/associated sxs/prior Treatment) Patient is a 47 y.o. female presenting with abdominal pain. The history is provided by the patient. No language interpreter was used.  Abdominal Pain Pain location:  RLQ Pain quality: sharp and stabbing   Pain radiates to:  Does not radiate Associated symptoms: constipation, diarrhea, nausea and vomiting   Associated symptoms: no chest pain, no chills, no dysuria, no fever, no shortness of breath, no vaginal bleeding and no vaginal discharge   Associated symptoms comment:  RLQ abdominal pain for the past 3 days, worse today. She has had problems with constipation alternating with loose stools for the past 2 weeks. No bloody bowel movements. She reports no known fever but has had intermittent chills.    Past Medical History  Diagnosis Date  . Hypothyroid   . Hashimoto's disease    Past Surgical History  Procedure Laterality Date  . Tubal ligation      by laparoscopy  . Nissen fundoplication     Family History  Problem Relation Age of Onset  . Adopted: Yes   History  Substance Use Topics  . Smoking status: Current Every Day Smoker -- 1.00 packs/day    Types: Cigarettes  . Smokeless tobacco: Never Used  . Alcohol Use: Yes     Comment: occ   OB History   Grav Para Term Preterm Abortions TAB SAB Ect Mult Living   3 2 2  1  1   2      Review of Systems  Constitutional: Negative for fever and chills.  Respiratory: Negative.  Negative for shortness of breath.   Cardiovascular: Negative.  Negative for chest pain.  Gastrointestinal: Positive for nausea, vomiting, abdominal pain, diarrhea and constipation. Negative for blood in stool.  Genitourinary: Negative.  Negative for dysuria, vaginal bleeding and  vaginal discharge.  Musculoskeletal: Negative.   Skin: Negative.   Neurological: Negative.     Allergies  Review of patient's allergies indicates no known allergies.  Home Medications   Current Outpatient Rx  Name  Route  Sig  Dispense  Refill  . levothyroxine (SYNTHROID, LEVOTHROID) 100 MCG tablet   Oral   Take 100 mcg by mouth daily before breakfast.         . b complex vitamins tablet   Oral   Take 1 tablet by mouth daily.           . Calcium Carb-Cholecalciferol (CALCIUM + D3) 600-200 MG-UNIT TABS   Oral   Take 1 tablet by mouth daily.         Marland Kitchen omeprazole (PRILOSEC) 10 MG capsule      TAKE 1 CAPSULE (10 MG TOTAL) BY MOUTH DAILY.   30 capsule   2   . ondansetron (ZOFRAN ODT) 8 MG disintegrating tablet   Oral   Take 1 tablet (8 mg total) by mouth every 8 (eight) hours as needed for nausea.   20 tablet   0    BP 122/99  Pulse 115  Temp(Src) 98 F (36.7 C) (Oral)  Resp 22  SpO2 100% Physical Exam  Constitutional: She is oriented to person, place, and time. She appears well-developed and well-nourished.  HENT:  Head: Normocephalic.  Neck: Normal range of motion. Neck supple.  Cardiovascular: Normal rate and regular rhythm.  Pulmonary/Chest: Effort normal and breath sounds normal.  Abdominal: Soft. Bowel sounds are normal. She exhibits no mass. There is tenderness. There is guarding. There is no rebound.  Tenderness focal to RLQ abdomen with guarding. Soft abdomen.  Musculoskeletal: Normal range of motion.  Neurological: She is alert and oriented to person, place, and time.  Skin: Skin is warm and dry. No rash noted.  Psychiatric: She has a normal mood and affect.    ED Course  Procedures (including critical care time) Labs Review Labs Reviewed  CBC WITH DIFFERENTIAL - Abnormal; Notable for the following:    WBC 13.7 (*)    Hemoglobin 11.4 (*)    MCV 77.4 (*)    MCH 24.3 (*)    RDW 16.2 (*)    Neutrophils Relative % 83 (*)    Neutro Abs 11.3  (*)    Lymphocytes Relative 11 (*)    All other components within normal limits  COMPREHENSIVE METABOLIC PANEL - Abnormal; Notable for the following:    Glucose, Bld 121 (*)    Total Bilirubin 0.1 (*)    All other components within normal limits  URINALYSIS, ROUTINE W REFLEX MICROSCOPIC - Abnormal; Notable for the following:    Hgb urine dipstick MODERATE (*)    All other components within normal limits  LIPASE, BLOOD  PREGNANCY, URINE  URINE MICROSCOPIC-ADD ON   Results for orders placed during the hospital encounter of 05/21/13  CBC WITH DIFFERENTIAL      Result Value Range   WBC 13.7 (*) 4.0 - 10.5 K/uL   RBC 4.69  3.87 - 5.11 MIL/uL   Hemoglobin 11.4 (*) 12.0 - 15.0 g/dL   HCT 16.1  09.6 - 04.5 %   MCV 77.4 (*) 78.0 - 100.0 fL   MCH 24.3 (*) 26.0 - 34.0 pg   MCHC 31.4  30.0 - 36.0 g/dL   RDW 40.9 (*) 81.1 - 91.4 %   Platelets 328  150 - 400 K/uL   Neutrophils Relative % 83 (*) 43 - 77 %   Neutro Abs 11.3 (*) 1.7 - 7.7 K/uL   Lymphocytes Relative 11 (*) 12 - 46 %   Lymphs Abs 1.5  0.7 - 4.0 K/uL   Monocytes Relative 5  3 - 12 %   Monocytes Absolute 0.7  0.1 - 1.0 K/uL   Eosinophils Relative 1  0 - 5 %   Eosinophils Absolute 0.1  0.0 - 0.7 K/uL   Basophils Relative 0  0 - 1 %   Basophils Absolute 0.0  0.0 - 0.1 K/uL  COMPREHENSIVE METABOLIC PANEL      Result Value Range   Sodium 141  135 - 145 mEq/L   Potassium 3.8  3.5 - 5.1 mEq/L   Chloride 104  96 - 112 mEq/L   CO2 24  19 - 32 mEq/L   Glucose, Bld 121 (*) 70 - 99 mg/dL   BUN 13  6 - 23 mg/dL   Creatinine, Ser 7.82  0.50 - 1.10 mg/dL   Calcium 9.5  8.4 - 95.6 mg/dL   Total Protein 8.0  6.0 - 8.3 g/dL   Albumin 4.2  3.5 - 5.2 g/dL   AST 16  0 - 37 U/L   ALT 20  0 - 35 U/L   Alkaline Phosphatase 87  39 - 117 U/L   Total Bilirubin 0.1 (*) 0.3 - 1.2 mg/dL   GFR calc non Af Amer >90  >90 mL/min   GFR calc Af Amer >  90  >90 mL/min  LIPASE, BLOOD      Result Value Range   Lipase 42  11 - 59 U/L  URINALYSIS,  ROUTINE W REFLEX MICROSCOPIC      Result Value Range   Color, Urine YELLOW  YELLOW   APPearance CLEAR  CLEAR   Specific Gravity, Urine 1.026  1.005 - 1.030   pH 6.0  5.0 - 8.0   Glucose, UA NEGATIVE  NEGATIVE mg/dL   Hgb urine dipstick MODERATE (*) NEGATIVE   Bilirubin Urine NEGATIVE  NEGATIVE   Ketones, ur NEGATIVE  NEGATIVE mg/dL   Protein, ur NEGATIVE  NEGATIVE mg/dL   Urobilinogen, UA 0.2  0.0 - 1.0 mg/dL   Nitrite NEGATIVE  NEGATIVE   Leukocytes, UA NEGATIVE  NEGATIVE  PREGNANCY, URINE      Result Value Range   Preg Test, Ur NEGATIVE  NEGATIVE  URINE MICROSCOPIC-ADD ON      Result Value Range   Squamous Epithelial / LPF RARE  RARE   RBC / HPF 7-10  <3 RBC/hpf   Bacteria, UA RARE  RARE    Imaging Review No results found.  EKG Interpretation   None       MDM   1. Abdominal pain    Symptoms controlled with medications. She is drinking contrast for ordered CT study. CT scanner is nonfunctional - patient to be transferred to [facility] ED, Dr. Pricilla Loveless made aware of the patient.    Arnoldo Hooker, PA-C 05/21/13 2001

## 2013-05-21 NOTE — ED Notes (Signed)
Patient transported to CT 

## 2013-05-21 NOTE — ED Notes (Signed)
C/o diarrhea/constipation x two weeks.  Onset of right lower quadrant abdominal pain on Sunday, worsened in the past 24 hours.  Hot flashes intermittently.  C/o pain worsening in her right lower quadrant with walking.

## 2013-05-21 NOTE — ED Notes (Signed)
Pt actively vomiting in triage 

## 2013-05-21 NOTE — ED Provider Notes (Signed)
Medical screening examination/treatment/procedure(s) were performed by non-physician practitioner and as supervising physician I was immediately available for consultation/collaboration.     Ellowyn Rieves, MD 05/21/13 2309 

## 2013-05-22 ENCOUNTER — Other Ambulatory Visit (HOSPITAL_COMMUNITY): Payer: 59

## 2013-05-22 ENCOUNTER — Ambulatory Visit: Payer: Self-pay | Admitting: Gynecology

## 2013-05-22 ENCOUNTER — Emergency Department (HOSPITAL_COMMUNITY): Payer: 59

## 2013-05-22 MED ORDER — METOCLOPRAMIDE HCL 10 MG PO TABS: 10.0000 mg | ORAL_TABLET | Freq: Four times a day (QID) | ORAL | Status: DC | PRN

## 2013-05-22 MED ORDER — ONDANSETRON 8 MG PO TBDP: ORAL_TABLET | ORAL | Status: DC

## 2013-05-22 NOTE — ED Notes (Signed)
Pt completed fluid challenge by drinking 240 ml of water.  Pt denies any nausea or vomiting.

## 2013-05-22 NOTE — ED Provider Notes (Signed)
Several days of multiple episodes of nonbloody vomiting and diarrhea with diffuse right-sided abdominal pain diffuse right-sided abdominal tenderness without rebound CT scan and US pelvis unremarkable; patient will followup with her GI doctor about questionable polyp. Patient feels much better in the emergency department.Patient / Family / Caregiver informed of clinical course, understand medical decision-making process, and agree with plan.  Hurman Horn, MD 05/23/13 [phone]

## 2013-05-24 LAB — GC/CHLAMYDIA PROBE AMP
CT Probe RNA: NEGATIVE
GC Probe RNA: NEGATIVE

## 2013-08-22 ENCOUNTER — Telehealth: Payer: Self-pay | Admitting: Endocrinology

## 2013-08-22 MED ORDER — LEVOTHYROXINE SODIUM 100 MCG PO TABS: 100.0000 ug | ORAL_TABLET | Freq: Every day | ORAL | Status: DC

## 2013-08-22 NOTE — Telephone Encounter (Signed)
Please call in the levothoroxin. Pt has not been seen since 3/14

## 2013-08-22 NOTE — Telephone Encounter (Signed)
30 day supply given. Pt will need appointment for further refills.

## 2013-09-08 NOTE — Telephone Encounter (Signed)
Lm for pt to return call and schedule an appt

## 2014-03-20 ENCOUNTER — Encounter: Payer: Self-pay | Admitting: Gynecology

## 2014-04-06 ENCOUNTER — Encounter (HOSPITAL_BASED_OUTPATIENT_CLINIC_OR_DEPARTMENT_OTHER): Payer: Self-pay | Admitting: Emergency Medicine

## 2014-05-05 IMAGING — US US TRANSVAGINAL NON-OB
1 series · 13 of 25 positions shown · non-contrast
Comparison: CT abdomen and pelvis 05/21/2013

CLINICAL DATA: Right adnexal pain question ovarian torsion

EXAM:
TRANSABDOMINAL AND TRANSVAGINAL ULTRASOUND OF PELVIS
TECHNIQUE: Both transabdominal and transvaginal ultrasound examinations of the
pelvis were performed. Transabdominal technique was performed for
global imaging of the pelvis including uterus, ovaries, adnexal
regions, and pelvic cul-de-sac. It was necessary to proceed with
endovaginal exam following the transabdominal exam to visualize the
ovaries. Due to discomfort/pressure, patient terminated exam prior
to localization assessment of the ovaries.

[Series 1: us transvaginal non-ob · 0.24mm/px · 39 acquisitions, 13 frames shown]
[im 1/39]
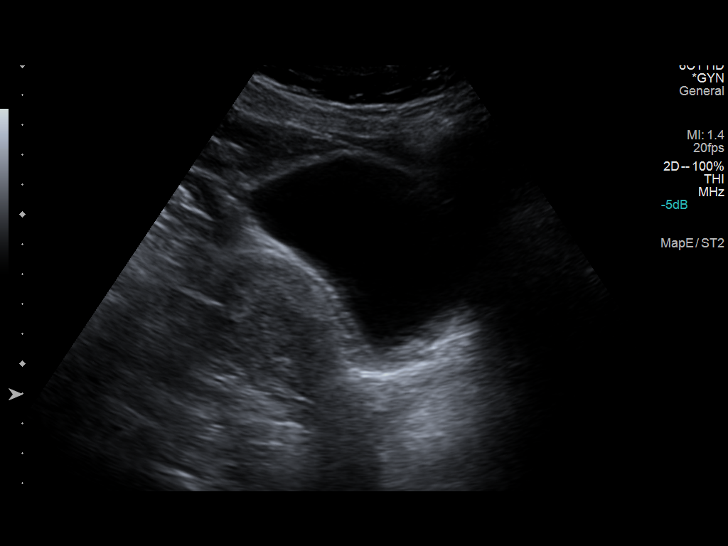
[im 4/39]
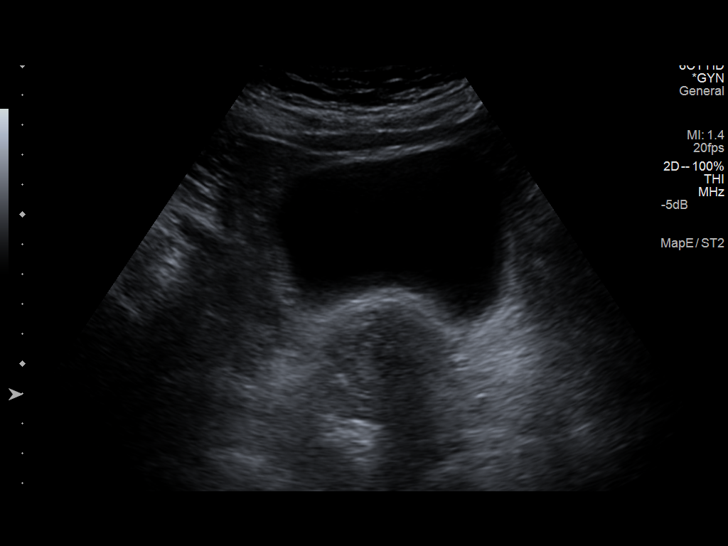
[im 7/39]
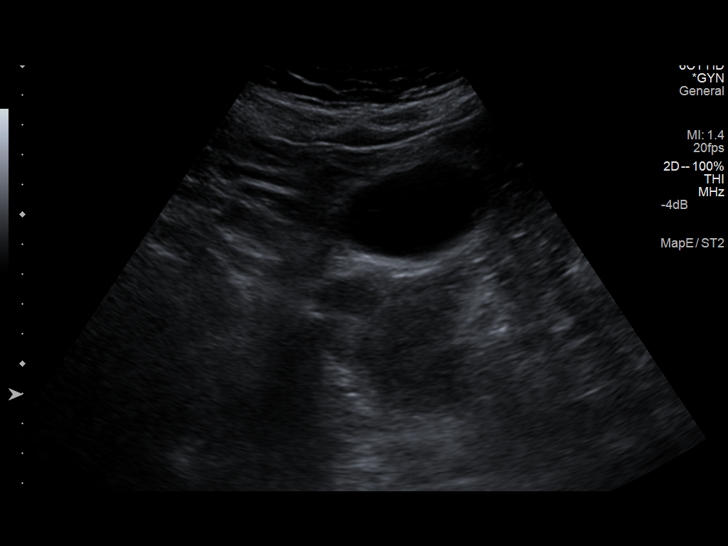
[im 10/39]
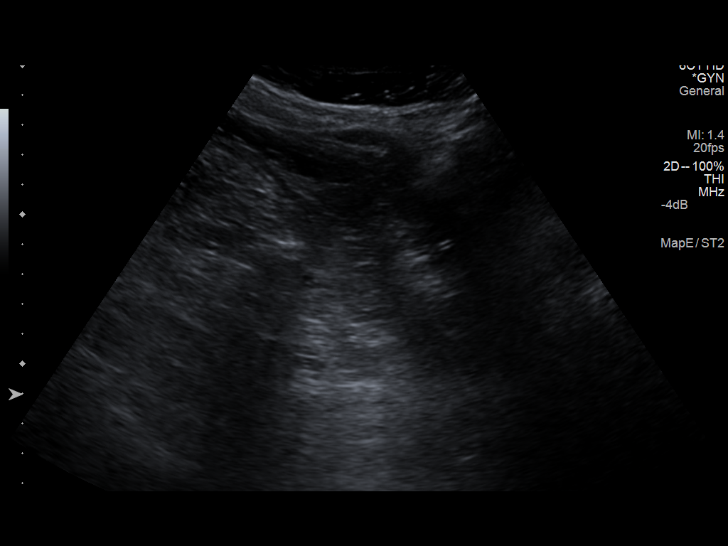
[im 13/39]
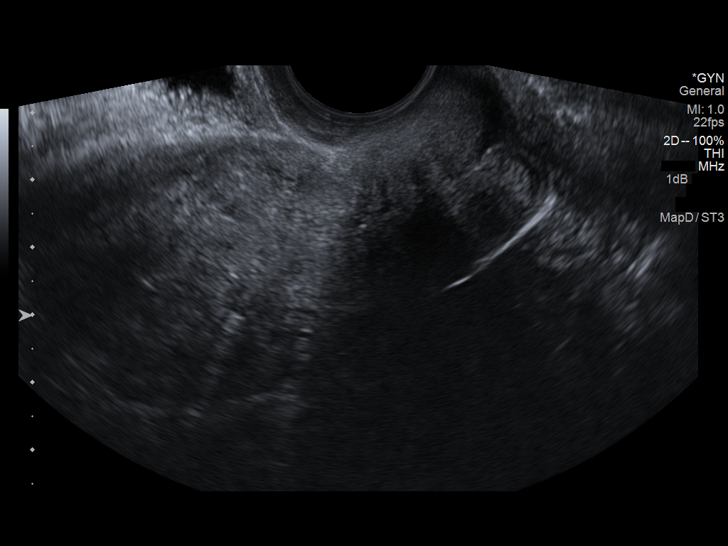
[im 16/39]
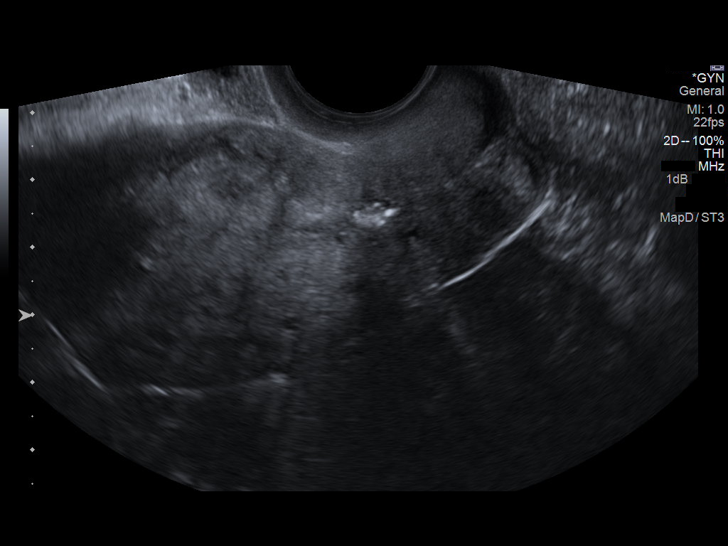
[im 20/39]
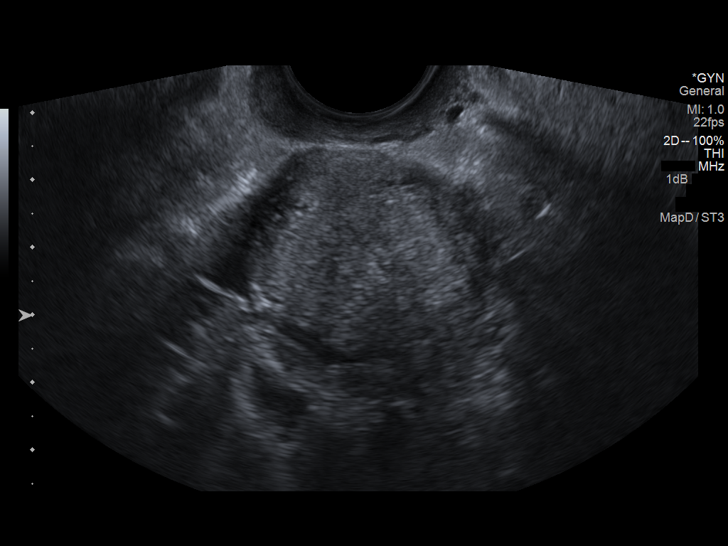
[im 23/39]
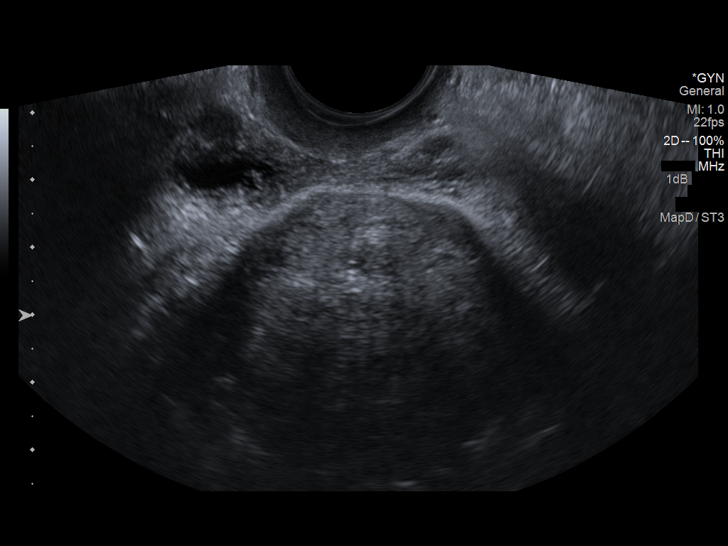
[im 26/39]
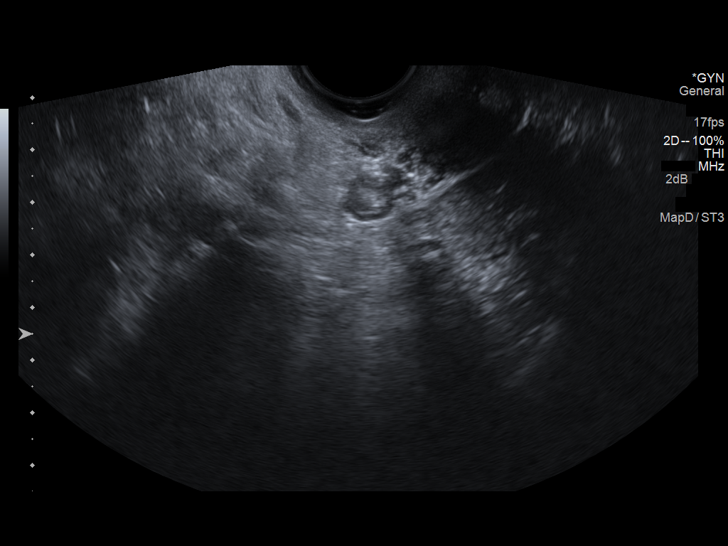
[im 29/39]
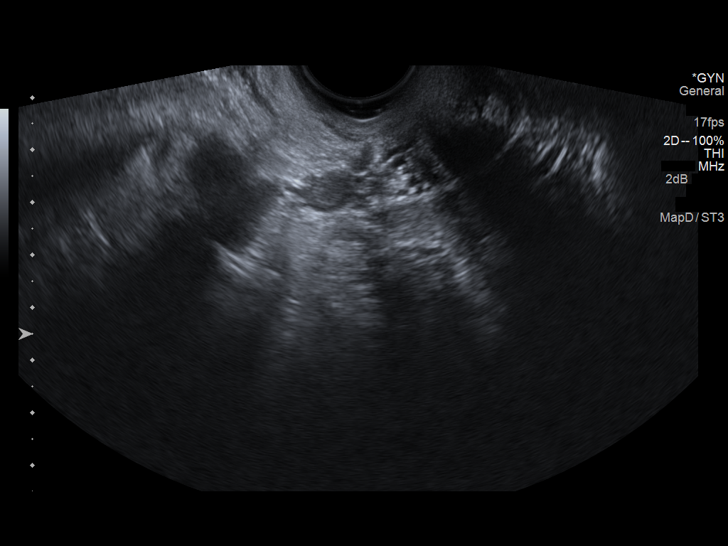
[im 32/39]
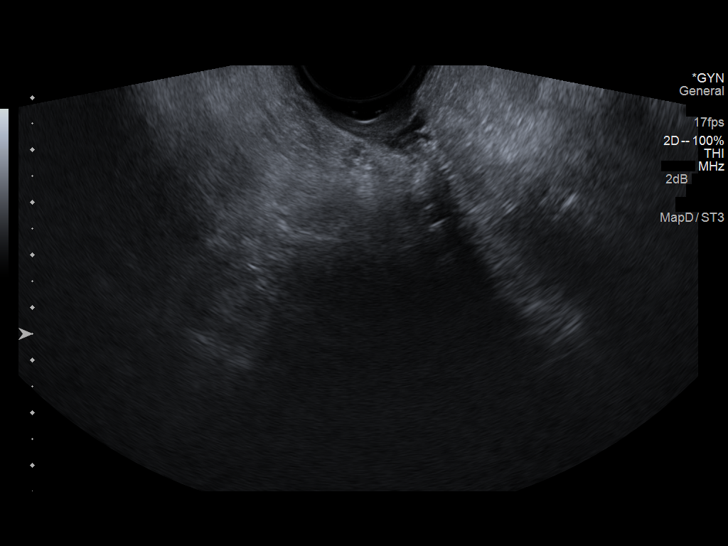
[im 35/39]
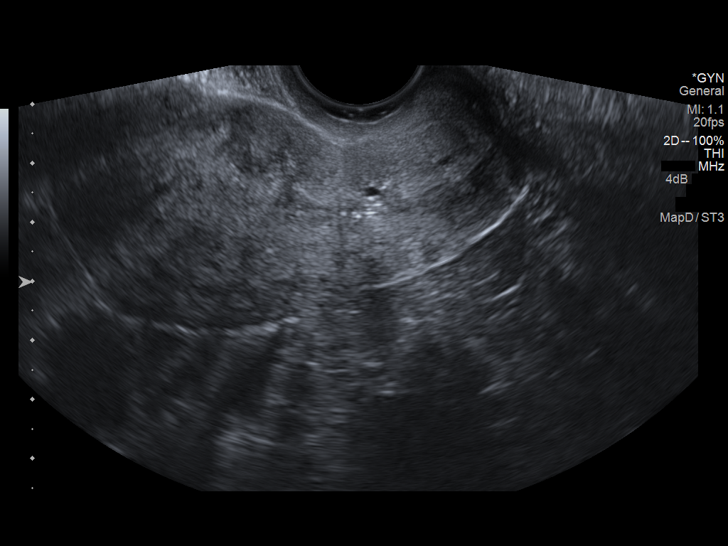
[im 39/39]
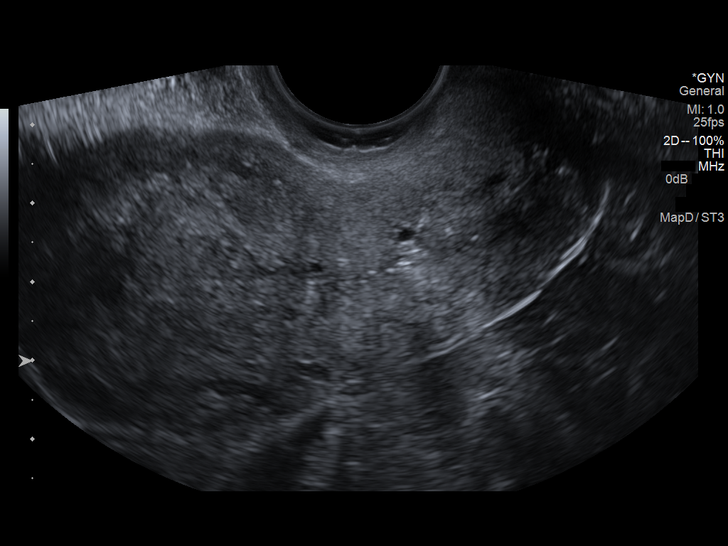

[13 of 25 positions shown; findings below may reference images not displayed]

FINDINGS: Uterus

Measurements: 7.7 x 3.9 x 4.2 cm. Small 7 mm intramural leiomyoma
identified at upper uterine segment.

Endometrium

Thickness: 4 mm thick, normal.  No endometrial fluid.

Right ovary

Not identified on either transabdominal or endovaginal imaging, see
above.

Left ovary

Not identified on either transabdominal or endovaginal imaging, see
above.

Other findings

No free pelvic fluid or adnexal masses visualized.
IMPRESSION: Tiny leiomyoma within uterus.

Nonvisualization of the ovaries on exam prematurely terminated by
patient due to pressure.

On the preceding CT exam of 05/21/2013, the ovaries were normal in
size and morphology.

## 2014-07-06 HISTORY — PX: AUGMENTATION MAMMAPLASTY: SUR837

## 2016-09-26 LAB — HM COLONOSCOPY

## 2016-10-04 ENCOUNTER — Telehealth: Payer: Self-pay

## 2016-10-04 DIAGNOSIS — N63 Unspecified lump in unspecified breast: Secondary | ICD-10-CM

## 2016-10-04 NOTE — Telephone Encounter (Signed)
Patient had mammo done through mobile unit that came to her work with [facility]. She said that something was abnormal and they recommended follow up study.  She wants to have that done at [facility] here in [place].  She asked for [place] to arrange for her.  Report is on your desk.  Pls advise / thanks

## 2016-10-04 NOTE — Telephone Encounter (Signed)
I called patient and let her know I received mammo report. Dr. Glenetta Hew said OK to order follow up imaging and [name] has placed orders and she will just need to call [facility] to schedule appt.

## 2016-10-04 NOTE — Telephone Encounter (Signed)
Orders placed at breast center, per Dr. Lily Peer verbally okay for pt have imaging done at breast center.

## 2016-10-04 NOTE — Telephone Encounter (Signed)
I called patient and let her know I received her mammo report. Dr. Glenetta Hew said ok to order follow up imaging and orders have been placed. She will just need to call and schedule appt with [facility].

## 2016-10-09 ENCOUNTER — Ambulatory Visit
Admission: RE | Admit: 2016-10-09 | Discharge: 2016-10-09 | Disposition: A | Discharge status: Home / Self Care | Payer: 59 | Source: Ambulatory Visit | Attending: Obstetrics & Gynecology | Admitting: Obstetrics & Gynecology

## 2016-10-09 DIAGNOSIS — N63 Unspecified lump in unspecified breast: Secondary | ICD-10-CM

## 2016-10-18 ENCOUNTER — Encounter: Payer: Self-pay | Admitting: Gynecology

## 2016-10-18 ENCOUNTER — Ambulatory Visit
Admission: RE | Admit: 2016-10-18 | Discharge: 2016-10-18 | Disposition: A | Discharge status: Home / Self Care | Payer: 59 | Source: Ambulatory Visit | Attending: Obstetrics & Gynecology | Admitting: Obstetrics & Gynecology

## 2016-10-18 ENCOUNTER — Other Ambulatory Visit: Payer: Self-pay | Admitting: Obstetrics & Gynecology

## 2016-10-18 ENCOUNTER — Ambulatory Visit: Admission: RE | Admit: 2016-10-18 | Payer: 59 | Source: Ambulatory Visit

## 2016-10-18 DIAGNOSIS — N63 Unspecified lump in unspecified breast: Secondary | ICD-10-CM

## 2016-11-27 ENCOUNTER — Ambulatory Visit (INDEPENDENT_AMBULATORY_CARE_PROVIDER_SITE_OTHER): Payer: 59 | Admitting: Family Medicine

## 2016-11-27 ENCOUNTER — Encounter: Payer: Self-pay | Admitting: Family Medicine

## 2016-11-27 VITALS — BP 110/80 | HR 70 | Temp 98.3°F | Ht 65.0 in | Wt 202.8 lb

## 2016-11-27 DIAGNOSIS — R197 Diarrhea, unspecified: Secondary | ICD-10-CM

## 2016-11-27 DIAGNOSIS — E785 Hyperlipidemia, unspecified: Secondary | ICD-10-CM

## 2016-11-27 DIAGNOSIS — E063 Autoimmune thyroiditis: Secondary | ICD-10-CM | POA: Diagnosis not present

## 2016-11-27 DIAGNOSIS — E038 Other specified hypothyroidism: Secondary | ICD-10-CM | POA: Diagnosis not present

## 2016-11-27 MED ORDER — PRAVASTATIN SODIUM 40 MG PO TABS: 40.0000 mg | ORAL_TABLET | Freq: Every day | ORAL | 2 refills | Status: DC

## 2016-11-27 MED ORDER — LEVOTHYROXINE SODIUM 125 MCG PO TABS: 125.0000 ug | ORAL_TABLET | Freq: Every day | ORAL | 2 refills | Status: DC

## 2016-11-27 NOTE — Patient Instructions (Addendum)
Remember to take your thyroid medicine 30 minutes before eating.   Aim to do some physical exertion for 150 minutes per week. This is typically divided into 5 days per week, 30 minutes per day. The activity should be enough to get your heart rate up. Anything is better than nothing if you have time constraints.  Let us know if you need anything in the meantime.

## 2016-11-27 NOTE — Progress Notes (Signed)
Chief Complaint  Patient presents with  . Establish Care    pt want to discuss cholesterol results,thyroid and restarting meds,and weight gain       New Patient Visit SUBJECTIVE: HPI: [name] is an 51 y.o.female who is being seen for establishing care.  The patient was previously seen at her OB/GYN's office, but he is retiring and she figures she needs a PCP.  Hyperlipidemia The patient recently had labwork done in March showing an LDL of greater than 190. She has been gaining weight since coming off of her thyroid medicine. She has not been physically active. She is working on lifestyle changes and has an appointment with her personal trainer coming up. She used to be on Lipitor, however had to stopped taking it due to muscle aches. She denies chest pain or shortness of breath.  Hypothyroidism The patient has a history of Hashimoto's thyroiditis. She used to be on Synthroid 125 g daily. Due to taking care of her ill mother, she stopped taking it. She has gained 30 pounds since then and has been experiencing lots of fatigue. Her bowel movements have been unchanged with the exception of diarrhea over the past 2 weeks. No palpitations or skin changes.  Diarrhea The patient has a history of alternating constipation and diarrhea. IBS was mentioned, but she never was given the dx. Her last colonoscopy/endoscopy was April of this year. She was told she has diverticulitis (I asked about diverticulosis and she stated it was the former). She is not having any bleeding or dark tarry stools, nor is she having any weight loss. She has never been checked for celiac's disease.  Lab review- LDL is 203 HDL is 48 A1c 5.3  Allergies  Allergen Reactions  . Morphine Nausea And Vomiting and Itching  . Morphine Sulfate Other (See Comments)    Past Medical History:  Diagnosis Date  . Blood in stool   . Diverticulitis   . GERD (gastroesophageal reflux disease)   . Hashimoto's disease   . History  of chicken pox   . History of colon polyps   . Hyperlipidemia   . Hypothyroid    Past Surgical History:  Procedure Laterality Date  . AUGMENTATION MAMMAPLASTY Bilateral 07/2014   saline implants with lifts   . NISSEN FUNDOPLICATION    . TONSILLECTOMY  1985  . TUBAL LIGATION     by laparoscopy   Social History   Social History  . Marital status: Divorced   Social History Main Topics  . Smoking status: Former Smoker    Packs/day: 1.00    Types: Cigarettes    Quit date: 03/05/2016  . Smokeless tobacco: Never Used  . Alcohol use Yes     Comment: occ  . Drug use: No  . Sexual activity: Yes    Birth control/ protection: Surgical     Comment: tubal ligation   Family History  Problem Relation Age of Onset  . Adopted: Yes     Current Outpatient Prescriptions:  .  ibuprofen (ADVIL,MOTRIN) 200 MG tablet, Take 200 mg by mouth every 6 (six) hours as needed., Disp: , Rfl:  .  omeprazole (PRILOSEC OTC) 20 MG tablet, Take 20 mg by mouth 2 (two) times daily., Disp: , Rfl:  .  cholecalciferol (VITAMIN D) 1000 UNITS tablet, Take 1,000 Units by mouth daily., Disp: , Rfl:  .  levothyroxine (SYNTHROID, LEVOTHROID) 125 MCG tablet, Take 1 tablet (125 mcg total) by mouth daily., Disp: 30 tablet, Rfl: 2 .  pravastatin (PRAVACHOL) 40 MG tablet, Take 1 tablet (40 mg total) by mouth daily., Disp: 30 tablet, Rfl: 2  Patient's last menstrual period was 09/05/2012.  ROS Cardiovascular: Denies chest pain  Respiratory: Denies dyspnea   OBJECTIVE: BP 110/80 (BP Location: Left Arm, Patient Position: Sitting, Cuff Size: Normal)   Pulse 70   Temp 98.3 F (36.8 C) (Oral)   Ht 5\' 5"  (1.651 m)   Wt 202 lb 12.8 oz (92 kg)   LMP 09/05/2012   SpO2 98%   BMI 33.75 kg/m   Constitutional: -  VS reviewed -  Well developed, well nourished, appears stated age -  No apparent distress  Psychiatric: -  Oriented to person, place, and time -  Memory intact -  Affect and mood normal -  Fluent  conversation, good eye contact -  Judgment and insight age appropriate  Eye: -  Conjunctivae clear, no discharge -  Pupils symmetric, round, reactive to light  ENMT: -  Oral mucosa without lesions, tongue and uvula midline    Tonsils not enlarged, no erythema, no exudate, trachea midline    Pharynx moist, no lesions, no erythema  Neck: -  No gross swelling, no palpable masses -  Thyroid midline, not enlarged, mobile, no palpable masses  Cardiovascular: -  RRR, no murmurs -  No LE edema  Respiratory: -  Normal respiratory effort, no accessory muscle use, no retraction -  Breath sounds equal, no wheezes, no ronchi, no crackles  Gastrointestinal: -  Bowel sounds normal -  No tenderness, no distention, no guarding, no masses  Skin: -  No significant lesion on inspection -  Warm and dry to palpation   ASSESSMENT/PLAN: Hypothyroidism due to Hashimoto's thyroiditis - Plan: levothyroxine (SYNTHROID, LEVOTHROID) 125 MCG tablet, Other/Misc lab test  Diarrhea, unspecified type - Plan: Tissue transglutaminase, IgA, Stool Culture, Ova and parasite examination, Other/Misc lab test, CANCELED: Endomysial ab scrn + titer, IgA  Hyperlipidemia, unspecified hyperlipidemia type - Plan: pravastatin (PRAVACHOL) 40 MG tablet  Patient instructed to sign release of records form from her previous PCP. We will refill her Synthroid and follow-up in 6 weeks to recheck free T4 and TSH. We'll screen for celiac disease and also get a stool culture. She may need to return to gastroenterology pending these results. I would like to start on pravastatin given her LDL level. She was encouraged to drink lots of water to help avoid muscle aches. Patient should return in 6 weeks. The patient voiced understanding and agreement to the plan.   Jilda Roche Krum, DO 11/27/16  10:50 AM

## 2016-11-28 LAB — TISSUE TRANSGLUTAMINASE, IGA: Tissue Transglutaminase Ab, IgA: 1 U/mL (ref <4)

## 2016-11-29 LAB — OVA AND PARASITE EXAMINATION: OP: NONE SEEN

## 2016-11-30 ENCOUNTER — Telehealth: Payer: Self-pay | Admitting: Family Medicine

## 2016-11-30 NOTE — Telephone Encounter (Signed)
Reviewed records: A1c 5.3 LDL 209- stay on statin vs considering changing to high efficacy, will discuss. 10 yr CVD is 2.1%.

## 2016-12-02 LAB — STOOL CULTURE

## 2016-12-08 ENCOUNTER — Other Ambulatory Visit: Payer: Self-pay | Admitting: Family Medicine

## 2016-12-08 ENCOUNTER — Telehealth: Payer: Self-pay | Admitting: Family Medicine

## 2016-12-08 DIAGNOSIS — R197 Diarrhea, unspecified: Secondary | ICD-10-CM

## 2016-12-08 NOTE — Telephone Encounter (Signed)
Called left message to call back 

## 2016-12-08 NOTE — Telephone Encounter (Signed)
Patient still having diarrhea every morning and sometimes after eating. Basically states stools are not formed. She is very concerned She had been eating gluten free 2 weeks before test and how would that effect this result? She has had diarrhea before and usually clears up but not this time---has had for 3 weeks.  Does not matter what food she eats she has consistently diarrhea.

## 2016-12-08 NOTE — Telephone Encounter (Signed)
Labs and stool are negative. We are still waiting on one more lab to come back which is why she hasn't been notified yet. Apologies for any misunderstanding. TY.

## 2016-12-08 NOTE — Telephone Encounter (Signed)
Looks like the only recent results in her chart are from June/stool test.  Not sure she was contacted, advise thanks,

## 2016-12-08 NOTE — Telephone Encounter (Signed)
Relation to ZO:XWRUpt:self Call back number: [phone]   Reason for call:  Patient would like to discuss most recent lab results.please advise best # [phone]

## 2016-12-08 NOTE — Telephone Encounter (Signed)
Try some metamucil to add some bulk. She will have to play around with different types of food to see which works for her. I think if she is still having issues, we should send her to GI for another opinion. Please place referral if she is OK with this plan. TY.

## 2016-12-08 NOTE — Telephone Encounter (Signed)
Patient informed of provider instructions. She did agree to GI referral/put in for her.

## 2016-12-18 ENCOUNTER — Other Ambulatory Visit: Payer: Self-pay | Admitting: *Deleted

## 2016-12-18 DIAGNOSIS — E063 Autoimmune thyroiditis: Secondary | ICD-10-CM

## 2016-12-18 DIAGNOSIS — E785 Hyperlipidemia, unspecified: Secondary | ICD-10-CM

## 2016-12-18 DIAGNOSIS — E038 Other specified hypothyroidism: Secondary | ICD-10-CM

## 2016-12-18 MED ORDER — PRAVASTATIN SODIUM 40 MG PO TABS
40.0000 mg | ORAL_TABLET | Freq: Every day | ORAL | 0 refills | Status: DC
Start: 2016-12-18 — End: 2017-02-15

## 2016-12-18 MED ORDER — LEVOTHYROXINE SODIUM 125 MCG PO TABS: 125.0000 ug | ORAL_TABLET | Freq: Every day | ORAL | 0 refills | Status: DC

## 2016-12-18 NOTE — Telephone Encounter (Signed)
Rx sent to the pharmacy by e-script.//AB/CMA 

## 2017-01-08 ENCOUNTER — Telehealth: Payer: Self-pay | Admitting: Family Medicine

## 2017-01-08 ENCOUNTER — Ambulatory Visit: Payer: Self-pay | Admitting: Family Medicine

## 2017-01-08 DIAGNOSIS — Z0289 Encounter for other administrative examinations: Secondary | ICD-10-CM

## 2017-01-08 NOTE — Telephone Encounter (Signed)
No charge. 

## 2017-01-08 NOTE — Telephone Encounter (Signed)
Patient lvm 01/07/17 at 8:54am cancelling 7:15am appointment for today due to family emergency, charge or no charge

## 2017-01-11 ENCOUNTER — Other Ambulatory Visit: Payer: Self-pay | Admitting: Family Medicine

## 2017-01-11 DIAGNOSIS — R197 Diarrhea, unspecified: Secondary | ICD-10-CM

## 2017-02-12 ENCOUNTER — Other Ambulatory Visit: Payer: Self-pay | Admitting: Family Medicine

## 2017-02-12 DIAGNOSIS — E785 Hyperlipidemia, unspecified: Secondary | ICD-10-CM

## 2017-02-12 DIAGNOSIS — E063 Autoimmune thyroiditis: Principal | ICD-10-CM

## 2017-02-12 DIAGNOSIS — E038 Other specified hypothyroidism: Secondary | ICD-10-CM

## 2017-02-15 ENCOUNTER — Other Ambulatory Visit: Payer: Self-pay | Admitting: Family Medicine

## 2017-02-15 DIAGNOSIS — E785 Hyperlipidemia, unspecified: Secondary | ICD-10-CM

## 2017-02-15 DIAGNOSIS — E038 Other specified hypothyroidism: Secondary | ICD-10-CM

## 2017-02-15 DIAGNOSIS — E063 Autoimmune thyroiditis: Secondary | ICD-10-CM

## 2017-02-15 MED ORDER — PRAVASTATIN SODIUM 40 MG PO TABS: 40.0000 mg | ORAL_TABLET | Freq: Every day | ORAL | 0 refills | Status: AC

## 2017-02-15 MED ORDER — LEVOTHYROXINE SODIUM 125 MCG PO TABS: 125.0000 ug | ORAL_TABLET | Freq: Every day | ORAL | 0 refills | Status: DC

## 2017-03-02 ENCOUNTER — Ambulatory Visit (HOSPITAL_BASED_OUTPATIENT_CLINIC_OR_DEPARTMENT_OTHER)
Admission: RE | Admit: 2017-03-02 | Discharge: 2017-03-02 | Disposition: A | Discharge status: Home / Self Care | Payer: 59 | Source: Ambulatory Visit | Attending: Nurse Practitioner | Admitting: Nurse Practitioner

## 2017-03-02 ENCOUNTER — Telehealth: Payer: Self-pay | Admitting: Nurse Practitioner

## 2017-03-02 ENCOUNTER — Encounter: Payer: Self-pay | Admitting: Nurse Practitioner

## 2017-03-02 ENCOUNTER — Ambulatory Visit (INDEPENDENT_AMBULATORY_CARE_PROVIDER_SITE_OTHER): Payer: 59 | Admitting: Nurse Practitioner

## 2017-03-02 VITALS — BP 130/82 | HR 90 | Temp 98.3°F | Resp 18 | Ht 65.0 in | Wt 214.6 lb

## 2017-03-02 DIAGNOSIS — R0602 Shortness of breath: Secondary | ICD-10-CM

## 2017-03-02 DIAGNOSIS — K449 Diaphragmatic hernia without obstruction or gangrene: Secondary | ICD-10-CM | POA: Diagnosis not present

## 2017-03-02 DIAGNOSIS — R0789 Other chest pain: Secondary | ICD-10-CM

## 2017-03-02 LAB — CBC WITH DIFFERENTIAL/PLATELET
BASOS PCT: 0.4 % (ref 0.0–3.0)
Basophils Absolute: 0 10*3/uL (ref 0.0–0.1)
EOS ABS: 0.1 10*3/uL (ref 0.0–0.7)
Eosinophils Relative: 1 % (ref 0.0–5.0)
HEMATOCRIT: 35.1 % — AB (ref 36.0–46.0)
Hemoglobin: 11.3 g/dL — ABNORMAL LOW (ref 12.0–15.0)
Lymphocytes Relative: 25.5 % (ref 12.0–46.0)
Lymphs Abs: 1.8 10*3/uL (ref 0.7–4.0)
MCHC: 32.2 g/dL (ref 30.0–36.0)
MCV: 88.8 fl (ref 78.0–100.0)
MONO ABS: 0.5 10*3/uL (ref 0.1–1.0)
Monocytes Relative: 7.3 % (ref 3.0–12.0)
NEUTROS ABS: 4.6 10*3/uL (ref 1.4–7.7)
Neutrophils Relative %: 65.8 % (ref 43.0–77.0)
PLATELETS: 263 10*3/uL (ref 150.0–400.0)
RBC: 3.95 Mil/uL (ref 3.87–5.11)
RDW: 13.4 % (ref 11.5–15.5)
WBC: 7.1 10*3/uL (ref 4.0–10.5)

## 2017-03-02 LAB — COMPREHENSIVE METABOLIC PANEL
ALT: 27 U/L (ref 0–35)
AST: 21 U/L (ref 0–37)
Albumin: 3.9 g/dL (ref 3.5–5.2)
Alkaline Phosphatase: 70 U/L (ref 39–117)
BUN: 10 mg/dL (ref 6–23)
CHLORIDE: 104 meq/L (ref 96–112)
CO2: 28 meq/L (ref 19–32)
CREATININE: 0.72 mg/dL (ref 0.40–1.20)
Calcium: 9 mg/dL (ref 8.4–10.5)
GFR: 90.27 mL/min (ref >60.00)
GLUCOSE: 90 mg/dL (ref 70–99)
Potassium: 4.1 mEq/L (ref 3.5–5.1)
SODIUM: 137 meq/L (ref 135–145)
Total Bilirubin: 0.2 mg/dL (ref 0.2–1.2)
Total Protein: 6.5 g/dL (ref 6.0–8.3)

## 2017-03-02 LAB — BRAIN NATRIURETIC PEPTIDE: Pro B Natriuretic peptide (BNP): 28 pg/mL (ref 0.0–100.0)

## 2017-03-02 LAB — TSH: TSH: 3.82 u[IU]/mL (ref 0.35–4.50)

## 2017-03-02 NOTE — Telephone Encounter (Signed)
Returned patients call to discuss lab work and chest x-ray.

## 2017-03-02 NOTE — Patient Instructions (Addendum)
Please stop by the lab for blood work today, then go downstairs for a chest x-ray.  We will contact you on Monday with your test results.  Our office will call you to schedule an echocardiogram of your heart and an appointment with cardiology.  Please follow up with your PCP in 1 week.  If you develop worsening chest pain or shortness of breath please go immediately to the nearest ER.

## 2017-03-02 NOTE — Telephone Encounter (Signed)
See My Chart Message.

## 2017-03-02 NOTE — Progress Notes (Signed)
Subjective:    Patient ID: [name], female    DOB: [birth year], 51 y.o.   MRN: [id number]  HPI Ms. Cardella is a 51 yo female who presents today for lower extremity edema. The edema began about five days ago. The edema is worse at the end of the day. It is hard to tie her shoes and her legs "feel tight." She noticed some lower extremity edema this summer that resolved after a few days.She reports consistent weight gain since May. Most of her clothes are too tight for her now. She does admit to fairly poor diet with many skipped meals and some fast food and does not regularly exercise.  She's also been experiencing some pressure across her entire chest that is intermittent. The pressure occurs daily. She denies any pressure now.   She stopped smoking last year and is managed for hypothyroidism with daily Synthroid. It has been some time since she's had her thyroid level checked.  She reports fatigue, dyspnea on exertion. She denies weakness, syncope, palpitations.  Wt Readings from Last 3 Encounters:  03/02/17 214 lb 9.6 oz (97.3 kg)  11/27/16 202 lb 12.8 oz (92 kg)  08/27/12 182 lb (82.6 kg)   Review of Systems  See HPI  Past Medical History:  Diagnosis Date  . Anemia   . Blood in stool   . Diverticulitis   . GERD (gastroesophageal reflux disease)   . Hashimoto's disease   . History of chicken pox   . History of colon polyps   . Hyperlipidemia   . Hypothyroid      Social History   Social History  . Marital status: Divorced    Spouse name: N/A  . Number of children: N/A  . Years of education: N/A   Occupational History  . Not on file.   Social History Main Topics  . Smoking status: Former Smoker    Packs/day: 1.00    Types: Cigarettes    Quit date: 03/05/2016  . Smokeless tobacco: Never Used  . Alcohol use Yes     Comment: occ  . Drug use: No  . Sexual activity: Yes    Birth control/ protection: Surgical     Comment: tubal ligation   Other Topics Concern    . Not on file   Social History Narrative  . No narrative on file    Past Surgical History:  Procedure Laterality Date  . AUGMENTATION MAMMAPLASTY Bilateral 07/2014   saline implants with lifts   . NISSEN FUNDOPLICATION    . TONSILLECTOMY  1985  . TUBAL LIGATION     by laparoscopy    Family History  Problem Relation Age of Onset  . Adopted: Yes    Allergies  Allergen Reactions  . Morphine Nausea And Vomiting and Itching  . Morphine Sulfate Other (See Comments)    Current Outpatient Prescriptions on File Prior to Visit  Medication Sig Dispense Refill  . cholecalciferol (VITAMIN D) 1000 UNITS tablet Take 1,000 Units by mouth daily.    Marland Kitchen ibuprofen (ADVIL,MOTRIN) 200 MG tablet Take 200 mg by mouth every 6 (six) hours as needed.    Marland Kitchen levothyroxine (SYNTHROID, LEVOTHROID) 125 MCG tablet Take 1 tablet (125 mcg total) by mouth daily. 90 tablet 0  . omeprazole (PRILOSEC OTC) 20 MG tablet Take 20 mg by mouth 2 (two) times daily.    . pravastatin (PRAVACHOL) 40 MG tablet Take 1 tablet (40 mg total) by mouth daily. 90 tablet 0   No current  facility-administered medications on file prior to visit.     BP 130/82 (BP Location: Right Arm, Cuff Size: Large)   Pulse 90   Temp 98.3 F (36.8 C) (Oral)   Resp 18   Ht  (1.651 m)   Wt 214 lb 9.6 oz (97.3 kg)   LMP 09/05/2012   SpO2 98%   BMI 35.71 kg/m       Objective:   Physical Exam  Constitutional: She is oriented to person, place, and time. She appears well-developed and well-nourished. No distress.  Obese BMI.  Cardiovascular: Normal rate, regular rhythm, normal heart sounds and intact distal pulses.   No murmur heard. Pulmonary/Chest: Effort normal and breath sounds normal. She has no wheezes. She has no rales.  Abdominal: Soft. Bowel sounds are normal. She exhibits no distension. There is no tenderness.  Musculoskeletal: She exhibits edema.  1+ pitting to bilateral lower extremities.  Neurological: She is alert and  oriented to person, place, and time.  Skin: Skin is warm. She is not diaphoretic.      Assessment & Plan:  Shortness of Breath EKG completed in clinic. I personally reviewed the EKG. NSR noted, no acute abnormalities. TSH, BNP, CBC/diff, CMET, CXR, ECHOCARDIOGRAM ordered to rule out cardiac etiology, heart failure, infection, electrolyte abnormalities. Referral to cardiology. Patient will follow up in 1 week with primary care provider. In the meantime she will go to the ER for worsening chest pain or shortness of breath. The patient agreed with the plan.  Alphonse Guild, NP

## 2017-03-02 NOTE — Telephone Encounter (Signed)
Pt called to check for lab results from this morning. please return call to pt.

## 2017-03-07 ENCOUNTER — Ambulatory Visit (HOSPITAL_COMMUNITY): Payer: 59 | Attending: Cardiology

## 2017-03-07 ENCOUNTER — Other Ambulatory Visit: Payer: Self-pay

## 2017-03-07 DIAGNOSIS — R609 Edema, unspecified: Secondary | ICD-10-CM | POA: Insufficient documentation

## 2017-03-07 DIAGNOSIS — R0602 Shortness of breath: Secondary | ICD-10-CM | POA: Insufficient documentation

## 2017-03-07 DIAGNOSIS — Z87891 Personal history of nicotine dependence: Secondary | ICD-10-CM | POA: Diagnosis not present

## 2017-03-07 DIAGNOSIS — E785 Hyperlipidemia, unspecified: Secondary | ICD-10-CM | POA: Insufficient documentation

## 2017-03-07 DIAGNOSIS — Z411 Encounter for cosmetic surgery: Secondary | ICD-10-CM | POA: Insufficient documentation

## 2017-03-09 ENCOUNTER — Ambulatory Visit (INDEPENDENT_AMBULATORY_CARE_PROVIDER_SITE_OTHER): Payer: 59 | Admitting: Family Medicine

## 2017-03-09 ENCOUNTER — Encounter: Payer: Self-pay | Admitting: Family Medicine

## 2017-03-09 VITALS — BP 120/84 | HR 87 | Temp 98.0°F | Ht 66.0 in | Wt 213.5 lb

## 2017-03-09 DIAGNOSIS — R609 Edema, unspecified: Secondary | ICD-10-CM | POA: Diagnosis not present

## 2017-03-09 DIAGNOSIS — D649 Anemia, unspecified: Secondary | ICD-10-CM

## 2017-03-09 NOTE — Progress Notes (Signed)
Pre visit review using our clinic review tool, if applicable. No additional management support is needed unless otherwise documented below in the visit note. 

## 2017-03-09 NOTE — Progress Notes (Signed)
Chief Complaint  Patient presents with  . Foot Swelling    [name] here for bilateral leg swelling.  Duration: 12 days Hx of prolonged bedrest, recent surgery, travel or injury? No Pain the calf? No SOB? No Personal or family history of clot or bleeding disorder? No Hx of heart failure, renal failure, hepatic failure? No- echo nml Recent labs nml, had mild decrease in Hb. Hx of Fe def, not currently taking any supplementation.  ROS:  MSK- +leg swelling, no pain Lungs- no SOB  Past Medical History:  Diagnosis Date  . Anemia   . Blood in stool   . Diverticulitis   . GERD (gastroesophageal reflux disease)   . Hashimoto's disease   . History of chicken pox   . History of colon polyps   . Hyperlipidemia   . Hypothyroid    Family History  Problem Relation Age of Onset  . Adopted: Yes   Past Surgical History:  Procedure Laterality Date  . AUGMENTATION MAMMAPLASTY Bilateral 07/2014   saline implants with lifts   . NISSEN FUNDOPLICATION    . TONSILLECTOMY  1985  . TUBAL LIGATION     by laparoscopy    Current Outpatient Prescriptions:  .  cholecalciferol (VITAMIN D) 1000 UNITS tablet, Take 1,000 Units by mouth daily., Disp: , Rfl:  .  ibuprofen (ADVIL,MOTRIN) 200 MG tablet, Take 200 mg by mouth every 6 (six) hours as needed., Disp: , Rfl:  .  levothyroxine (SYNTHROID, LEVOTHROID) 125 MCG tablet, Take 1 tablet (125 mcg total) by mouth daily., Disp: 90 tablet, Rfl: 0 .  omeprazole (PRILOSEC OTC) 20 MG tablet, Take 20 mg by mouth 2 (two) times daily., Disp: , Rfl:  .  pravastatin (PRAVACHOL) 40 MG tablet, Take 1 tablet (40 mg total) by mouth daily., Disp: 90 tablet, Rfl: 0  BP 120/84 (BP Location: Left Arm, Patient Position: Sitting, Cuff Size: Large)   Pulse 87   Temp 98 F (36.7 C) (Oral)   Ht  (1.676 m)   Wt 213 lb 8 oz (96.8 kg)   LMP 09/05/2012   SpO2 99%   BMI 34.46 kg/m  Gen- awake, alert, appears stated age Heart- RRR, no murmurs, 2+ bl LE edema  tapering off to knees, no bruits Lungs- CTAB, normal effort w/o accessory muscle use MSK- No calf pain Psych: Age appropriate judgment and insight  Dependent edema  Anemia, unspecified type  Orders as above. Compression stockings rx given. Elevate legs. Counseled on diet nad exercise. She is motivated, weighed 160 lbs last year.  Hx of Fe def, take some Fe.  F/u prn. Pt voiced understanding and agreement to the plan.  Jilda Roche [place], DO 03/09/17  8:49 AM

## 2017-03-09 NOTE — Patient Instructions (Addendum)
Get back in the gym. Clean up the diet. I think you know what to do, but let us know if you have any questions.  Stop the pravastatin for now. Send me a MyChart message in 4 weeks letting me know how you are doing.   Elevate your legs.   Take iron every other day.

## 2017-04-03 NOTE — Progress Notes (Deleted)
Cardiology Office Note   Date:  04/03/2017   ID:  Eara, Burruel 1965-11-05, MRN [id number]  PCP:  Sharlene Dory, DO  Cardiologist:   Charlton Haws, MD   No chief complaint on file.     History of Present Illness: [name] is a 51 y.o. female who presents for consultation regarding dyspnea. Referred by Dr Carmelia Roller. She has history Of anemia and LE edema. Has gained 53 lbs in last year. Edema since this summer. Dependant and worse at end of day Sedentary with poor diet. She stopped smoking 2017 History of thyroid disease on replacement TSH, BNP normal Hct 35.1   Echo : 03/07/17 normal systolic and diastolic function EF 60-65% CXR: 03/02/17 NAD Hiatal Hernia    Past Medical History:  Diagnosis Date  . Anemia   . Blood in stool   . Diverticulitis   . GERD (gastroesophageal reflux disease)   . Hashimoto's disease   . History of chicken pox   . History of colon polyps   . Hyperlipidemia   . Hypothyroid     Past Surgical History:  Procedure Laterality Date  . AUGMENTATION MAMMAPLASTY Bilateral 07/2014   saline implants with lifts   . NISSEN FUNDOPLICATION    . TONSILLECTOMY  1985  . TUBAL LIGATION     by laparoscopy     Current Outpatient Prescriptions  Medication Sig Dispense Refill  . cholecalciferol (VITAMIN D) 1000 UNITS tablet Take 1,000 Units by mouth daily.    Marland Kitchen ibuprofen (ADVIL,MOTRIN) 200 MG tablet Take 200 mg by mouth every 6 (six) hours as needed.    Marland Kitchen levothyroxine (SYNTHROID, LEVOTHROID) 125 MCG tablet Take 1 tablet (125 mcg total) by mouth daily. 90 tablet 0  . omeprazole (PRILOSEC OTC) 20 MG tablet Take 20 mg by mouth 2 (two) times daily.    . pravastatin (PRAVACHOL) 40 MG tablet Take 1 tablet (40 mg total) by mouth daily. 90 tablet 0   No current facility-administered medications for this visit.     Allergies:   Morphine and Morphine sulfate    Social History:  The patient  reports that she quit smoking about 12 months ago. Her  smoking use included Cigarettes. She smoked 1.00 pack per day. She has never used smokeless tobacco. She reports that she drinks alcohol. She reports that she does not use drugs.   Family History:  The patient's family history is not on file. She was adopted.    ROS:  Please see the history of present illness.   Otherwise, review of systems are positive for none.   All other systems are reviewed and negative.    PHYSICAL EXAM: VS:  LMP 09/05/2012  , BMI There is no height or weight on file to calculate BMI. Affect appropriate Healthy:  appears stated age HEENT: normal Neck supple with no adenopathy JVP normal no bruits no thyromegaly Lungs clear with no wheezing and good diaphragmatic motion Heart:  S1/S2 no murmur, no rub, gallop or click PMI normal  Breast implants  Abdomen: benighn, BS positve, no tenderness, no AAA no bruit.  No HSM or HJR Distal pulses intact with no bruits No edema Neuro non-focal Skin warm and dry No muscular weakness    EKG:  03/02/17 SR rate 78 low voltage due to body habitus otherwise normal    Recent Labs: 03/02/2017: ALT 27; BUN 10; Creatinine, Ser 0.72; Hemoglobin 11.3; Platelets 263.0; Potassium 4.1; Pro B Natriuretic peptide (BNP) 28.0; Sodium 137; TSH 3.82  Lipid Panel    Component Value Date/Time   CHOL 249 (H) 05/28/2012 0833   TRIG 140 05/28/2012 0833   HDL 40 05/28/2012 0833   CHOLHDL 6.2 05/28/2012 0833   VLDL 28 05/28/2012 0833   LDLCALC 181 (H) 05/28/2012 0833      Wt Readings from Last 3 Encounters:  03/09/17 213 lb 8 oz (96.8 kg)  03/02/17 214 lb 9.6 oz (97.3 kg)  11/27/16 202 lb 12.8 oz (92 kg)      Other studies Reviewed: Additional studies/ records that were reviewed today include: Notes primary , labs Echo and ECG , CXR.    ASSESSMENT AND PLAN:  1.  Edema  *** 2. Dyspnea 3. Thyroid TSH normal continue current replacement dose  4. Cholesterol:  On statin labs with primary     Current medicines are  reviewed at length with the patient today.  The patient does not have concerns regarding medicines.  The following changes have been made:  ***  Labs/ tests ordered today include: *** No orders of the defined types were placed in this encounter.    Disposition:   FU with cardiology PRN      Signed, Charlton HawsPeter Taji Sather, MD  04/03/2017 12:59 PM    [facility] [address], [place], [place]  [address] Phone: [phone]; Fax: [phone]

## 2017-04-04 ENCOUNTER — Ambulatory Visit: Payer: 59 | Admitting: Cardiovascular Disease

## 2017-05-16 ENCOUNTER — Telehealth: Payer: Self-pay | Admitting: Family Medicine

## 2017-05-16 DIAGNOSIS — E063 Autoimmune thyroiditis: Principal | ICD-10-CM

## 2017-05-16 DIAGNOSIS — E038 Other specified hypothyroidism: Secondary | ICD-10-CM

## 2017-05-16 MED ORDER — LEVOTHYROXINE SODIUM 125 MCG PO TABS: 125.0000 ug | ORAL_TABLET | Freq: Every day | ORAL | 0 refills | Status: DC

## 2017-05-16 NOTE — Telephone Encounter (Signed)
Copied from CRM #[id number]. Topic: Quick Communication - See Telephone Encounter >> May 16, 2017 11:10 AM [name], [name], [name] wrote: CRM for notification. See Telephone encounter for: Christina RuizJohn from [facility] is calling to verbal approval for levothyroxine (SYNTHROID, LEVOTHROID) 125 MCG tablet and wanted to get a verbal approval. Christina RuizJohn has faxed and called several times.  05/16/17.

## 2017-05-16 NOTE — Telephone Encounter (Signed)
Refill sent to [facility] Patient notified/left detailed message refill done as requested

## 2017-11-03 ENCOUNTER — Other Ambulatory Visit: Payer: Self-pay | Admitting: Family Medicine

## 2017-11-03 DIAGNOSIS — E063 Autoimmune thyroiditis: Principal | ICD-10-CM

## 2017-11-03 DIAGNOSIS — E038 Other specified hypothyroidism: Secondary | ICD-10-CM

## 2018-02-13 IMAGING — DX DG CHEST 2V
2 series · 2 of 2 positions shown · non-contrast
Comparison: None

CLINICAL DATA: Feet and hands swelling, sob, chest pressure
w/exertion this week.

EXAM:
CHEST  2 VIEW

[chest pa]
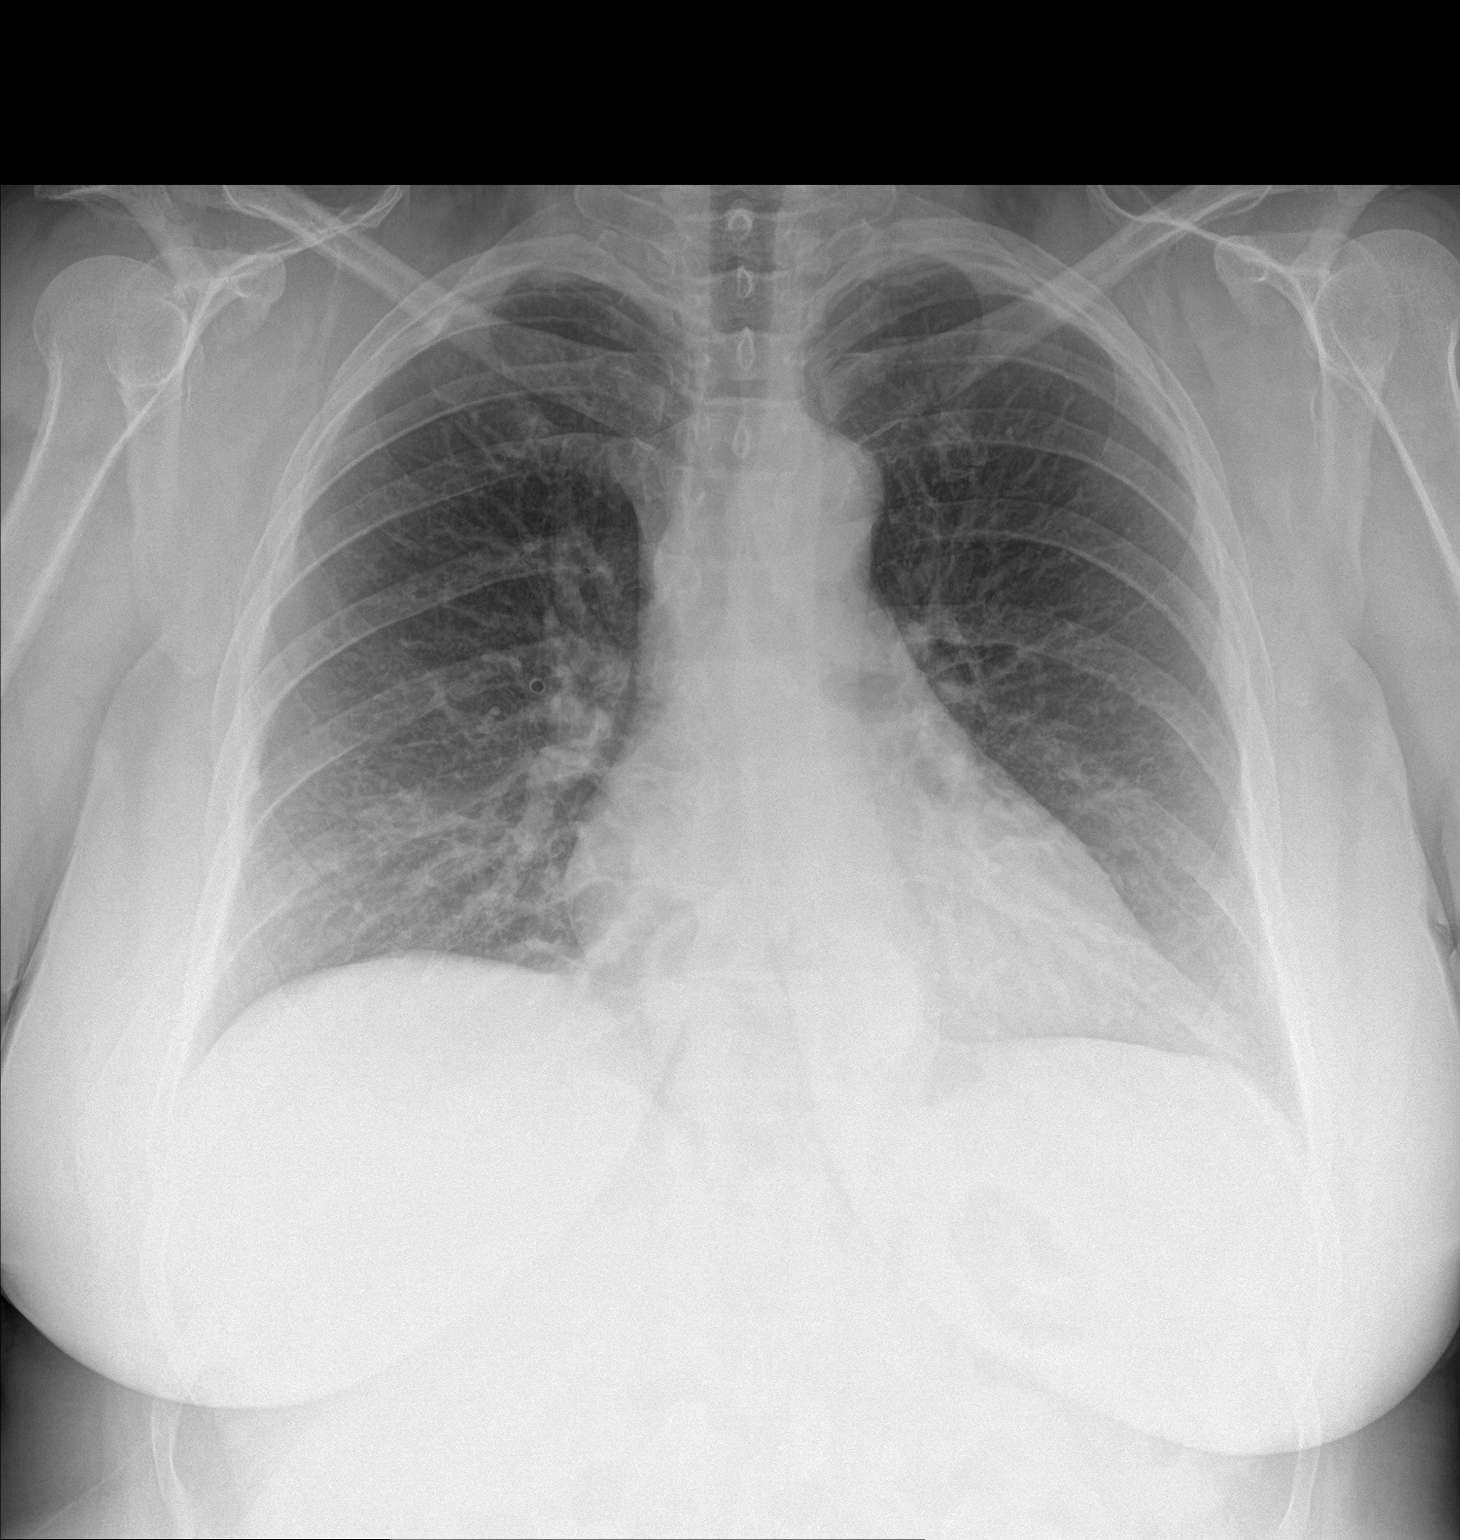

[chest lat]
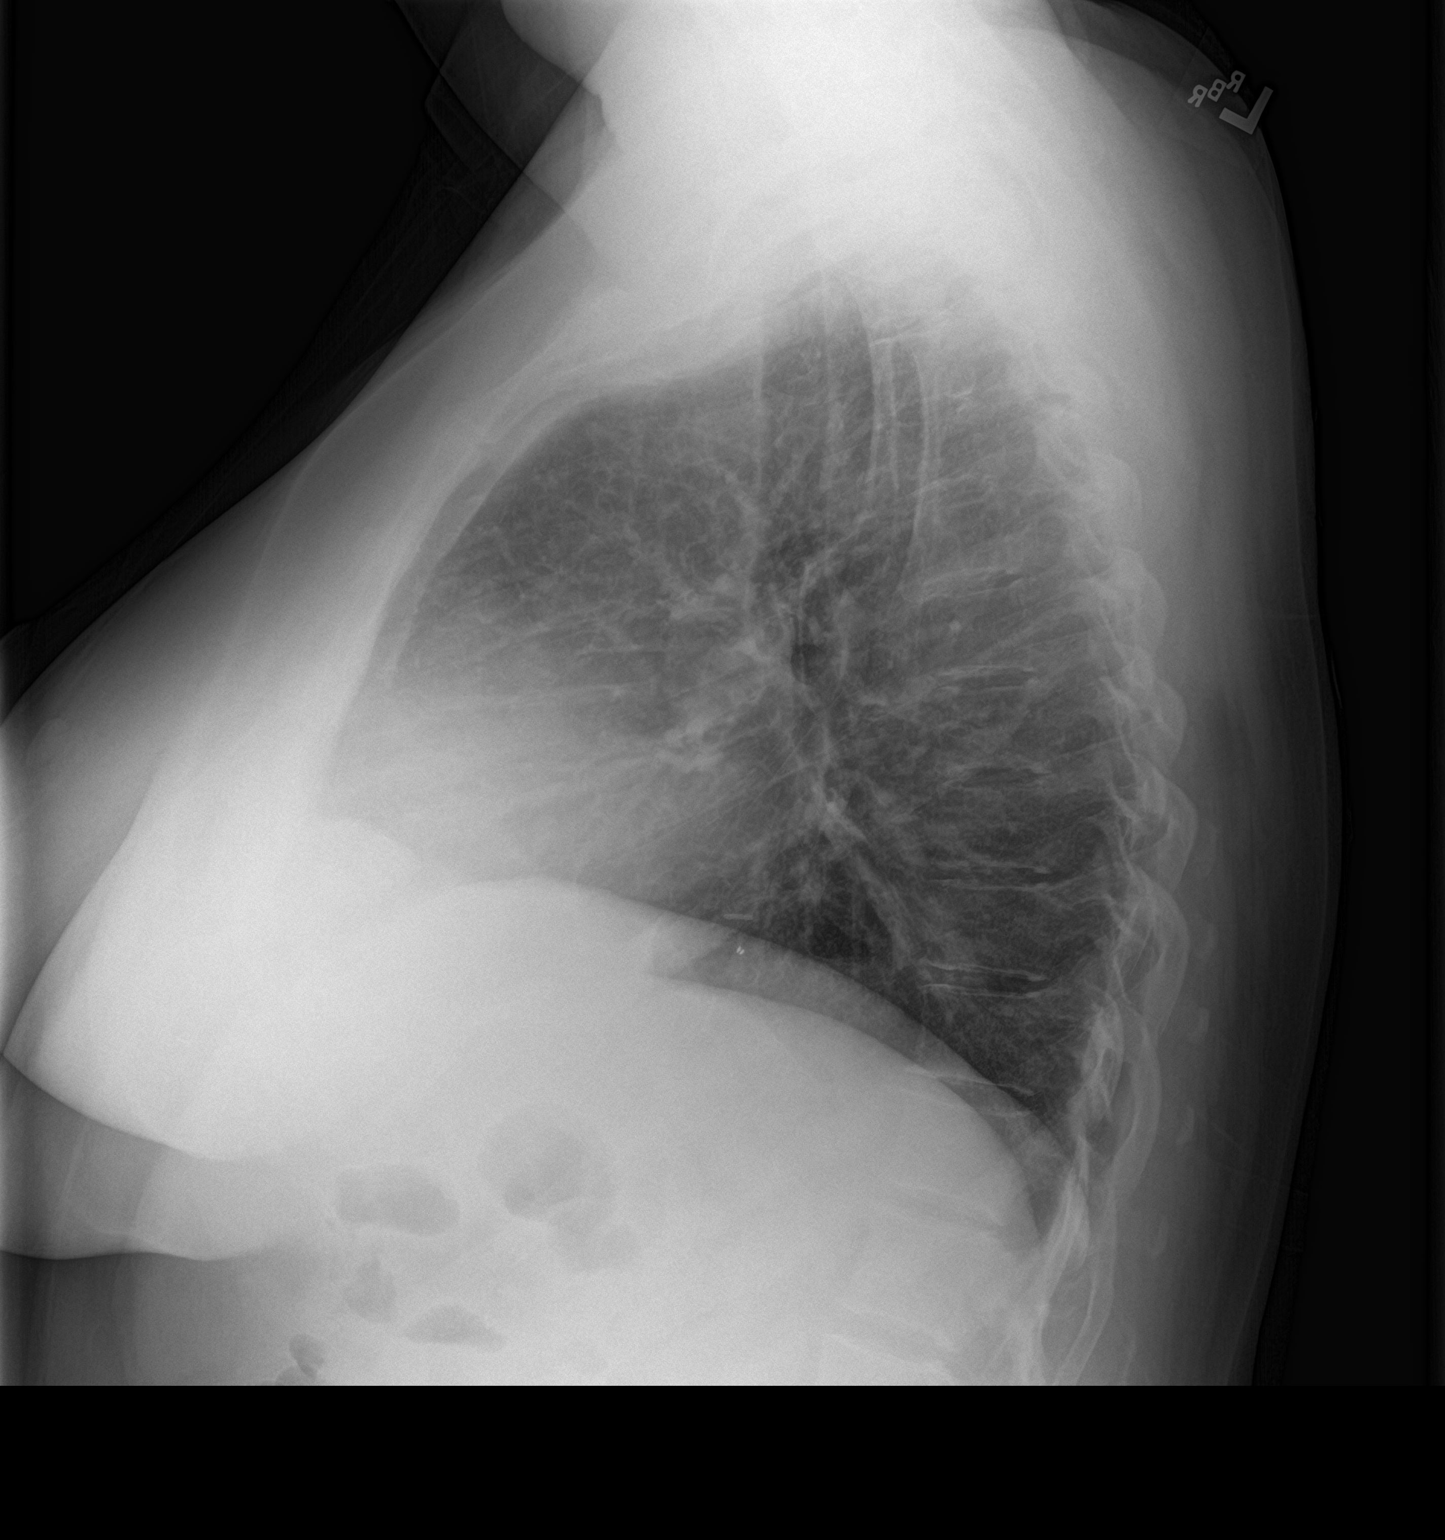

[2 of 2 positions shown; findings below may reference images not displayed]

FINDINGS: Heart size is normal. The lungs are free of focal consolidations and
pleural effusions. No pulmonary edema. Note is made of hiatal
hernia.
IMPRESSION: No evidence for acute cardiopulmonary abnormality.  Hiatal hernia.

## 2018-03-23 ENCOUNTER — Other Ambulatory Visit: Payer: Self-pay | Admitting: Family Medicine

## 2018-03-23 DIAGNOSIS — E038 Other specified hypothyroidism: Secondary | ICD-10-CM

## 2018-03-23 DIAGNOSIS — E063 Autoimmune thyroiditis: Principal | ICD-10-CM

## 2018-05-18 ENCOUNTER — Other Ambulatory Visit: Payer: Self-pay | Admitting: Family Medicine

## 2018-05-18 DIAGNOSIS — E038 Other specified hypothyroidism: Secondary | ICD-10-CM

## 2018-05-18 DIAGNOSIS — E063 Autoimmune thyroiditis: Principal | ICD-10-CM

## 2019-06-16 ENCOUNTER — Ambulatory Visit: Payer: Self-pay | Admitting: *Deleted

## 2019-06-16 NOTE — Telephone Encounter (Signed)
Pt calling to get a virtual appointment to have blood work done, where she is located now in [place]. She stated that she passed out in June and July and found out her Hgb was 6.4 and was getting iron transfusions until they had to stop because of covid.  Her Hgb came up to 9.6.  She is out of town now and thinks her Hgb is again dropping. She is experiencing some dizziness and feeling off balance along with being jittery.  She would like to do an appointment with her pcp to get an order to have blood drawn.   She is advised to drink more liquids. Get up slowly. Will notify LB at [facility] for an appointment. Call conference in with patient.  Reason for Disposition . [1] MILD dizziness (e.g., walking normally) AND [2] has NOT been evaluated by physician for this  (Exception: dizziness caused by heat exposure, sudden standing, or poor fluid intake)  Answer Assessment - Initial Assessment Questions 1. DESCRIPTION: "Describe your dizziness."     Light headed 2. LIGHTHEADED: "Do you feel lightheaded?" (e.g., somewhat faint, woozy, weak upon standing)     yes 3. VERTIGO: "Do you feel like either you or the room is spinning or tilting?" (i.e. vertigo)     thought it was vertigo at one point when the room felt like it was tilting  4. SEVERITY: "How bad is it?"  "Do you feel like you are going to faint?" "Can you stand and walk?"   - MILD - walking normally   - MODERATE - interferes with normal activities (e.g., work, school)    - SEVERE - unable to stand, requires support to walk, feels like passing out now.      mild 5. ONSET:  "When did the dizziness begin?"     yesterday 6. AGGRAVATING FACTORS: "Does anything make it worse?" (e.g., standing, change in head position)     Change in head position 7. HEART RATE: "Can you tell me your heart rate?" "How many beats in 15 seconds?"  (Note: not all patients can do this)        8. CAUSE: "What do you think is causing the dizziness?"     Her  blood is dropping. Happened once before 9. RECURRENT SYMPTOM: "Have you had dizziness before?" If so, ask: "When was the last time?" "What happened that time?"     10. OTHER SYMPTOMS: "Do you have any other symptoms?" (e.g., fever, chest pain, vomiting, diarrhea, bleeding)       Some nausea  11. PREGNANCY: "Is there any chance you are pregnant?" "When was your last menstrual period?"       n/a  Protocols used: DIZZINESS [facility]

## 2019-06-16 NOTE — Telephone Encounter (Signed)
Probably should see her on Wed. If there is a lab, it would be nice to know the fax info so we can send orders promptly. She will likely need to f/u with a provider down there though. Ty.

## 2019-06-16 NOTE — Telephone Encounter (Signed)
Called the patient and she is unable to schedule on Wednesday due to her work schedule. Informed to get the fax number for a lab in [facility] so we can fax orders for her and also she would need to find a provider in [facility] to followup with regarding this.  She agreed to this as well. She agreed to all information

## 2019-06-16 NOTE — Telephone Encounter (Signed)
You have not seen this patient since October of 2018.  She has scheduled a virtual visit 06/20/19 Advise

## 2019-06-18 ENCOUNTER — Ambulatory Visit: Payer: 59 | Admitting: Family Medicine

## 2019-06-20 ENCOUNTER — Ambulatory Visit: Payer: Self-pay | Admitting: Family Medicine
